# Patient Record
Sex: Female | Born: 1945
Health system: Southern US, Community
[De-identification: ages and names within clinical notes are randomized; demographics above are authoritative.]

## PROBLEM LIST (undated history)

## (undated) DIAGNOSIS — M810 Age-related osteoporosis without current pathological fracture: Secondary | ICD-10-CM

## (undated) DIAGNOSIS — S3992XA Unspecified injury of lower back, initial encounter: Secondary | ICD-10-CM

## (undated) DIAGNOSIS — E785 Hyperlipidemia, unspecified: Secondary | ICD-10-CM

## (undated) DIAGNOSIS — I10 Essential (primary) hypertension: Secondary | ICD-10-CM

## (undated) HISTORY — PX: POLYPECTOMY: SHX149

## (undated) HISTORY — DX: Age-related osteoporosis without current pathological fracture: M81.0

## (undated) HISTORY — DX: Hyperlipidemia, unspecified: E78.5

## (undated) HISTORY — DX: Essential (primary) hypertension: I10

## (undated) HISTORY — DX: Unspecified injury of lower back, initial encounter: S39.92XA

---

## 1999-06-28 ENCOUNTER — Other Ambulatory Visit: Admission: RE | Admit: 1999-06-28 | Discharge: 1999-06-28 | Payer: Self-pay | Admitting: *Deleted

## 1999-06-28 ENCOUNTER — Encounter: Admission: RE | Admit: 1999-06-28 | Discharge: 1999-06-28 | Payer: Self-pay | Admitting: *Deleted

## 1999-06-28 ENCOUNTER — Encounter: Payer: Self-pay | Admitting: *Deleted

## 2000-06-28 ENCOUNTER — Encounter: Payer: Self-pay | Admitting: *Deleted

## 2000-06-28 ENCOUNTER — Other Ambulatory Visit: Admission: RE | Admit: 2000-06-28 | Discharge: 2000-06-28 | Payer: Self-pay | Admitting: *Deleted

## 2000-06-28 ENCOUNTER — Encounter: Admission: RE | Admit: 2000-06-28 | Discharge: 2000-06-28 | Payer: Self-pay | Admitting: *Deleted

## 2002-04-11 ENCOUNTER — Other Ambulatory Visit: Admission: RE | Admit: 2002-04-11 | Discharge: 2002-04-11 | Payer: Self-pay | Admitting: Gynecology

## 2002-07-03 ENCOUNTER — Encounter: Payer: Self-pay | Admitting: Gynecology

## 2002-07-03 ENCOUNTER — Encounter: Admission: RE | Admit: 2002-07-03 | Discharge: 2002-07-03 | Payer: Self-pay | Admitting: Family Medicine

## 2003-07-07 ENCOUNTER — Encounter: Admission: RE | Admit: 2003-07-07 | Discharge: 2003-07-07 | Payer: Self-pay | Admitting: Gynecology

## 2004-07-14 ENCOUNTER — Encounter: Admission: RE | Admit: 2004-07-14 | Discharge: 2004-07-14 | Payer: Self-pay | Admitting: Obstetrics and Gynecology

## 2005-07-15 ENCOUNTER — Encounter: Admission: RE | Admit: 2005-07-15 | Discharge: 2005-07-15 | Payer: Self-pay | Admitting: Obstetrics and Gynecology

## 2006-07-18 ENCOUNTER — Encounter: Admission: RE | Admit: 2006-07-18 | Discharge: 2006-07-18 | Payer: Self-pay | Admitting: Obstetrics and Gynecology

## 2007-08-15 ENCOUNTER — Encounter: Admission: RE | Admit: 2007-08-15 | Discharge: 2007-08-15 | Payer: Self-pay | Admitting: Obstetrics and Gynecology

## 2008-08-15 ENCOUNTER — Encounter: Admission: RE | Admit: 2008-08-15 | Discharge: 2008-08-15 | Payer: Self-pay | Admitting: Obstetrics and Gynecology

## 2009-08-18 ENCOUNTER — Encounter: Admission: RE | Admit: 2009-08-18 | Discharge: 2009-08-18 | Payer: Self-pay | Admitting: Obstetrics and Gynecology

## 2010-07-15 ENCOUNTER — Other Ambulatory Visit: Payer: Self-pay | Admitting: Obstetrics and Gynecology

## 2010-07-15 DIAGNOSIS — Z1239 Encounter for other screening for malignant neoplasm of breast: Secondary | ICD-10-CM

## 2010-08-24 ENCOUNTER — Ambulatory Visit
Admission: RE | Admit: 2010-08-24 | Discharge: 2010-08-24 | Disposition: A | Discharge status: Home / Self Care | Payer: BC Managed Care – PPO | Source: Ambulatory Visit | Attending: Obstetrics and Gynecology | Admitting: Obstetrics and Gynecology

## 2010-08-24 DIAGNOSIS — Z1239 Encounter for other screening for malignant neoplasm of breast: Secondary | ICD-10-CM

## 2011-07-19 ENCOUNTER — Other Ambulatory Visit: Payer: Self-pay | Admitting: Family Medicine

## 2011-07-19 DIAGNOSIS — Z1231 Encounter for screening mammogram for malignant neoplasm of breast: Secondary | ICD-10-CM

## 2011-08-25 ENCOUNTER — Ambulatory Visit
Admission: RE | Admit: 2011-08-25 | Discharge: 2011-08-25 | Disposition: A | Discharge status: Home / Self Care | Payer: BC Managed Care – PPO | Source: Ambulatory Visit | Attending: Family Medicine | Admitting: Family Medicine

## 2011-08-25 DIAGNOSIS — Z1231 Encounter for screening mammogram for malignant neoplasm of breast: Secondary | ICD-10-CM

## 2011-09-06 ENCOUNTER — Other Ambulatory Visit: Payer: Self-pay | Admitting: Family Medicine

## 2011-09-06 DIAGNOSIS — Z780 Asymptomatic menopausal state: Secondary | ICD-10-CM

## 2011-09-13 ENCOUNTER — Ambulatory Visit
Admission: RE | Admit: 2011-09-13 | Discharge: 2011-09-13 | Disposition: A | Discharge status: Home / Self Care | Payer: BC Managed Care – PPO | Source: Ambulatory Visit | Attending: Family Medicine | Admitting: Family Medicine

## 2011-09-13 DIAGNOSIS — Z780 Asymptomatic menopausal state: Secondary | ICD-10-CM

## 2012-08-10 ENCOUNTER — Other Ambulatory Visit: Payer: Self-pay | Admitting: Family Medicine

## 2012-08-10 DIAGNOSIS — Z1231 Encounter for screening mammogram for malignant neoplasm of breast: Secondary | ICD-10-CM

## 2012-09-03 ENCOUNTER — Ambulatory Visit
Admission: RE | Admit: 2012-09-03 | Discharge: 2012-09-03 | Disposition: A | Discharge status: Home / Self Care | Payer: BC Managed Care – PPO | Source: Ambulatory Visit | Attending: Family Medicine | Admitting: Family Medicine

## 2013-02-11 ENCOUNTER — Other Ambulatory Visit: Payer: BC Managed Care – PPO

## 2013-02-11 DIAGNOSIS — I10 Essential (primary) hypertension: Secondary | ICD-10-CM

## 2013-02-11 DIAGNOSIS — E785 Hyperlipidemia, unspecified: Secondary | ICD-10-CM

## 2013-02-11 LAB — COMPREHENSIVE METABOLIC PANEL
AST: 16 U/L (ref 0–37)
Albumin: 4.4 g/dL (ref 3.5–5.2)
Calcium: 10 mg/dL (ref 8.4–10.5)
Chloride: 105 mEq/L (ref 96–112)
Sodium: 141 mEq/L (ref 135–145)
Total Bilirubin: 0.5 mg/dL (ref 0.3–1.2)

## 2013-02-11 LAB — LIPID PANEL: LDL Cholesterol: 100 mg/dL — ABNORMAL HIGH (ref 0–99)

## 2013-02-13 ENCOUNTER — Encounter: Payer: Self-pay | Admitting: Physician Assistant

## 2013-02-13 ENCOUNTER — Ambulatory Visit (INDEPENDENT_AMBULATORY_CARE_PROVIDER_SITE_OTHER): Payer: BC Managed Care – PPO | Admitting: Physician Assistant

## 2013-02-13 VITALS — BP 118/70 | HR 93 | Temp 98.7°F | Resp 18 | Ht 62.75 in | Wt 164.0 lb

## 2013-02-13 DIAGNOSIS — I10 Essential (primary) hypertension: Secondary | ICD-10-CM | POA: Insufficient documentation

## 2013-02-13 DIAGNOSIS — M810 Age-related osteoporosis without current pathological fracture: Secondary | ICD-10-CM

## 2013-02-13 DIAGNOSIS — E785 Hyperlipidemia, unspecified: Secondary | ICD-10-CM

## 2013-02-13 MED ORDER — ALENDRONATE SODIUM 70 MG PO TABS: 70.0000 mg | ORAL_TABLET | ORAL | Status: DC

## 2013-02-13 MED ORDER — LOSARTAN POTASSIUM-HCTZ 100-25 MG PO TABS: 1.0000 | ORAL_TABLET | Freq: Every day | ORAL | Status: DC

## 2013-02-13 MED ORDER — SIMVASTATIN 20 MG PO TABS: 20.0000 mg | ORAL_TABLET | Freq: Every day | ORAL | Status: DC

## 2013-02-13 MED ORDER — AMLODIPINE BESYLATE 5 MG PO TABS: 5.0000 mg | ORAL_TABLET | Freq: Every day | ORAL | Status: DC

## 2013-02-13 NOTE — Progress Notes (Signed)
Patient ID: Miranda Ramirez MRN: [id number], DOB: [birth year], 67 y.o. Date of Encounter: @DATE @  Chief Complaint:  Chief Complaint  Patient presents with  . follow up after labs    HPI: 67 y.o. year old white female  presents for routine followup office visit. She is a patient of Dr. Lelon Perla in the past. She started coming to this office by Dr. Lelon Perla was seeing patients here. But then her prior PCP retired at about the same time that Dr. Lelon Perla retired so she decided to just continue to come here to this office for followup her medical care.  I follow her for complete physical exam March of 2013. I then saw her for regular office visit in February 2014. She is taking the blood pressure medications as directed. She is having no adverse effects including no treatment edema.  She is taking the simvastatin 20 mg as directed as well. She is having no myalgias or other adverse effects.  She is taking the Fosamax once a week. She is having no problems with her jaw and no pain in her thigh. No signs or symptoms of osteonecrosis of the jaw and no signs or symptoms of atypical fracture of the femur. She is also still on the calcium and vitamin D.   Past Medical History  Diagnosis Date  . Hyperlipidemia   . Hypertension   . Osteoporosis      Home Meds: See attached medication section for current medication list. Any medications entered into computer today will not appear on this note's list. The medications listed below were entered prior to today. No current outpatient prescriptions on file prior to visit.   No current facility-administered medications on file prior to visit.    Allergies: No Known Allergies  History   Social History  . Marital Status: Married    Spouse Name: N/A    Number of Children: N/A  . Years of Education: N/A   Occupational History  . Not on file.   Social History Main Topics  . Smoking status: Never Smoker   . Smokeless tobacco: Never Used  .  Alcohol Use: No  . Drug Use: No  . Sexual Activity: Not on file   Other Topics Concern  . Not on file   Social History Narrative  . No narrative on file    Family History  Problem Relation Age of Onset  . Diabetes Sister   . COPD Sister   . Cancer Sister 49    kidney cancer     Review of Systems:  See HPI for pertinent ROS. All other ROS negative.    Physical Exam: Blood pressure 118/70, pulse 93, temperature 98.7 F (37.1 C), temperature source Oral, resp. rate 18, height 5' 2.75" (1.594 m), weight 164 lb (74.39 kg)., Body mass index is 29.28 kg/(m^2). General: Well nourished well-developed white female Appears in no acute distress. Neck: Supple. No thyromegaly. No lymphadenopathy. No carotid bruit Lungs: Clear bilaterally to auscultation without wheezes, rales, or rhonchi. Breathing is unlabored. Heart: RRR with S1 S2. No murmurs, rubs, or gallops. Abdomen: Soft, non-tender, non-distended with normoactive bowel sounds. No hepatomegaly. No rebound/guarding. No obvious abdominal masses. Musculoskeletal:  Strength and tone normal for age. Extremities/Skin: Warm and dry.  No edema. No rashes or suspicious lesions. Neuro: Alert and oriented X 3. Moves all extremities spontaneously. Gait is normal. CNII-XII grossly in tact. Psych:  Responds to questions appropriately with a normal affect.   Results for orders placed in visit on 02/11/13  COMPREHENSIVE METABOLIC PANEL      Result Value Range   Sodium 141  135 - 145 mEq/L   Potassium 4.6  3.5 - 5.3 mEq/L   Chloride 105  96 - 112 mEq/L   CO2 29  19 - 32 mEq/L   Glucose, Bld 92  70 - 99 mg/dL   BUN 20  6 - 23 mg/dL   Creat 1.61  0.96 - 0.45 mg/dL   Total Bilirubin 0.5  0.3 - 1.2 mg/dL   Alkaline Phosphatase 30 (*) 39 - 117 U/L   AST 16  0 - 37 U/L   ALT 19  0 - 35 U/L   Total Protein 6.8  6.0 - 8.3 g/dL   Albumin 4.4  3.5 - 5.2 g/dL   Calcium 40.9  8.4 - 81.1 mg/dL  LIPID PANEL      Result Value Range   Cholesterol 174   0 - 200 mg/dL   Triglycerides 95  <914 mg/dL   HDL 55  >78 mg/dL   Total CHOL/HDL Ratio 3.2     VLDL 19  0 - 40 mg/dL   LDL Cholesterol 295 (*) 0 - 99 mg/dL     ASSESSMENT AND PLAN:  67 y.o. year old female with  1. Hypertension At goal. He met normal. Continue current medications. Recheck 6 months.  2. Hyperlipidemia At goal. Continue current medications. Recheck 6 months  3. Osteoporosis She started the Fosamax March 2013. Should have repeat DEXA after one year of therapy. She has not had any followup DEXA since starting the Fosamax. She is agreeable to do so. We'll schedule this now.  We'll continue calcium vitamin D and Fosamax for now. - DG Bone Density; Future  4. screening mammogram: She have followup performed on 08/10/2012. This was normal.  5. screening colonoscopy: Patient is aware of the risk versus benefits. However she still defers this.  6. immunizations: A. pneumococcal this was given here on 08/31/2011 Zostavax: Patient defers. Even after long discussion she says that she has never had chickenpox. She still defers Zostavax.  [address] [place], [place], [facility] 02/13/2013 4:52 PM

## 2013-02-27 ENCOUNTER — Telehealth: Payer: Self-pay | Admitting: Family Medicine

## 2013-02-27 NOTE — Telephone Encounter (Signed)
Pt has left mess to call her back.  Her question was about Bone Density study ordered at last visit.  When [facility] to have done they questioned why it was needed early.  Her regular two year visit not due until March 2015 unless there was a medical reason to do sooner.  I called [facility] and was told still only need every two year scan unless patient on long term use of Steroids, then can request annually.  Will wait and reschedule in March 2015.

## 2013-02-28 NOTE — Telephone Encounter (Signed)
She had just started Fosamax in March 2013. I thought that a repeat DEXA was to be done in one year after starting initial therapy. However if this is not the protocol and that is fine to wait 2 years.

## 2013-03-02 ENCOUNTER — Other Ambulatory Visit: Payer: Self-pay | Admitting: Family Medicine

## 2013-07-24 ENCOUNTER — Other Ambulatory Visit: Payer: BC Managed Care – PPO

## 2013-07-24 DIAGNOSIS — Z79899 Other long term (current) drug therapy: Secondary | ICD-10-CM

## 2013-07-24 DIAGNOSIS — I10 Essential (primary) hypertension: Secondary | ICD-10-CM

## 2013-07-24 DIAGNOSIS — E785 Hyperlipidemia, unspecified: Secondary | ICD-10-CM

## 2013-07-24 DIAGNOSIS — M810 Age-related osteoporosis without current pathological fracture: Secondary | ICD-10-CM

## 2013-07-24 LAB — CBC WITH DIFFERENTIAL/PLATELET
Basophils Absolute: 0 10*3/uL (ref 0.0–0.1)
Basophils Relative: 0 % (ref 0–1)
Eosinophils Absolute: 0.2 10*3/uL (ref 0.0–0.7)
Eosinophils Relative: 2 % (ref 0–5)
HEMATOCRIT: 40.9 % (ref 36.0–46.0)
Hemoglobin: 13.4 g/dL (ref 12.0–15.0)
LYMPHS PCT: 38 % (ref 12–46)
Lymphs Abs: 3.9 10*3/uL (ref 0.7–4.0)
MCH: 28.8 pg (ref 26.0–34.0)
MCHC: 32.8 g/dL (ref 30.0–36.0)
MCV: 87.8 fL (ref 78.0–100.0)
Monocytes Absolute: 0.7 10*3/uL (ref 0.1–1.0)
Monocytes Relative: 7 % (ref 3–12)
NEUTROS PCT: 53 % (ref 43–77)
Neutro Abs: 5.4 10*3/uL (ref 1.7–7.7)
PLATELETS: 377 10*3/uL (ref 150–400)
RBC: 4.66 MIL/uL (ref 3.87–5.11)
RDW: 13.1 % (ref 11.5–15.5)
WBC: 10.2 10*3/uL (ref 4.0–10.5)

## 2013-07-24 LAB — COMPLETE METABOLIC PANEL WITH GFR
ALBUMIN: 4.2 g/dL (ref 3.5–5.2)
ALT: 15 U/L (ref 0–35)
AST: 12 U/L (ref 0–37)
Alkaline Phosphatase: 31 U/L — ABNORMAL LOW (ref 39–117)
BILIRUBIN TOTAL: 0.5 mg/dL (ref 0.2–1.2)
BUN: 11 mg/dL (ref 6–23)
CHLORIDE: 107 meq/L (ref 96–112)
CO2: 27 mEq/L (ref 19–32)
Calcium: 9.2 mg/dL (ref 8.4–10.5)
Creat: 0.66 mg/dL (ref 0.50–1.10)
GLUCOSE: 97 mg/dL (ref 70–99)
POTASSIUM: 4.1 meq/L (ref 3.5–5.3)
SODIUM: 143 meq/L (ref 135–145)
Total Protein: 6.5 g/dL (ref 6.0–8.3)

## 2013-07-24 LAB — LIPID PANEL
Cholesterol: 163 mg/dL (ref 0–200)
HDL: 48 mg/dL (ref >39)
LDL CALC: 80 mg/dL (ref 0–99)
TRIGLYCERIDES: 173 mg/dL — AB (ref <150)
Total CHOL/HDL Ratio: 3.4 Ratio
VLDL: 35 mg/dL (ref 0–40)

## 2013-07-25 ENCOUNTER — Ambulatory Visit (INDEPENDENT_AMBULATORY_CARE_PROVIDER_SITE_OTHER): Payer: BC Managed Care – PPO | Admitting: Physician Assistant

## 2013-07-25 ENCOUNTER — Encounter: Payer: Self-pay | Admitting: Physician Assistant

## 2013-07-25 VITALS — BP 146/90 | HR 76 | Temp 98.9°F | Resp 18 | Ht 62.5 in | Wt 162.0 lb

## 2013-07-25 DIAGNOSIS — M810 Age-related osteoporosis without current pathological fracture: Secondary | ICD-10-CM

## 2013-07-25 DIAGNOSIS — E559 Vitamin D deficiency, unspecified: Secondary | ICD-10-CM

## 2013-07-25 DIAGNOSIS — E785 Hyperlipidemia, unspecified: Secondary | ICD-10-CM

## 2013-07-25 DIAGNOSIS — I10 Essential (primary) hypertension: Secondary | ICD-10-CM

## 2013-07-25 LAB — VITAMIN D 25 HYDROXY (VIT D DEFICIENCY, FRACTURES): VIT D 25 HYDROXY: 19 ng/mL — AB (ref 30–89)

## 2013-07-25 NOTE — Progress Notes (Signed)
Patient ID: Miranda Ramirez MRN: [id number], DOB: 23-Aug-1945, 68 y.o. Date of Encounter: '@DATE' @  Chief Complaint:  Chief Complaint  Patient presents with  . follow up after labs/ med RF    HPI: 68 y.o. year old female  Presents for f/u OV.  She was a patient of Dr. Harriett Rush in the past. She started coming to this office when Dr. Ree Edman was seeing patients here.  But then her prior PCP retired at about the same time that Dr. Harriett Rush retired.  She decided to continue to come here to this office for her followup medical care.  She had a complete physical exam with me March 2013. She has had regular office visits with me February 2014 and then again 01/2013. She is here again today for followup regular office visit.  She has no complaints today and says that she's been feeling good.  She is taking blood pressure medications as directed. Having no adverse effects including no LE edema. No lightheadedness.  She is taking simvastatin 20 mg. She is having no myalgias or other adverse effects.  She is taking Fosamax once a week. Having no jaw pain and no pain in the thigh. No other adv effects.    Past Medical History  Diagnosis Date  . Hyperlipidemia   . Hypertension   . Osteoporosis      Home Meds: See attached medication section for current medication list. Any medications entered into computer today will not appear on this note's list. The medications listed below were entered prior to today. Current Outpatient Prescriptions on File Prior to Visit  Medication Sig Dispense Refill  . alendronate (FOSAMAX) 70 MG tablet TAKE ONE TABLET BY MOUTH ONCE A WEEK  4 tablet  11  . amLODipine (NORVASC) 5 MG tablet Take 1 tablet (5 mg total) by mouth daily.  30 tablet  11  . losartan-hydrochlorothiazide (HYZAAR) 100-25 MG per tablet Take 1 tablet by mouth daily.  30 tablet  5  . simvastatin (ZOCOR) 20 MG tablet Take 1 tablet (20 mg total) by mouth at bedtime.  30 tablet  5   No current  facility-administered medications on file prior to visit.    Allergies: No Known Allergies  History   Social History  . Marital Status: Married    Spouse Name: N/A    Number of Children: N/A  . Years of Education: N/A   Occupational History  . Not on file.   Social History Main Topics  . Smoking status: Never Smoker   . Smokeless tobacco: Never Used  . Alcohol Use: No  . Drug Use: No  . Sexual Activity: Not on file   Other Topics Concern  . Not on file   Social History Narrative  . No narrative on file    Family History  Problem Relation Age of Onset  . Diabetes Sister   . COPD Sister   . Cancer Sister 54    kidney cancer     Review of Systems:  See HPI for pertinent ROS. All other ROS negative.    Physical Exam: Blood pressure 146/90, pulse 76, temperature 98.9 F (37.2 C), temperature source Oral, resp. rate 18, height 5' 2.5" (1.588 m), weight 162 lb (73.483 kg)., Body mass index is 29.14 kg/(m^2). General: WNWD WF. Appears in no acute distress. Neck: Supple. No thyromegaly. No lymphadenopathy. No carotid bruit. Lungs: Clear bilaterally to auscultation without wheezes, rales, or rhonchi. Breathing is unlabored. Heart: RRR with S1 S2. No murmurs, rubs,  or gallops. Abdomen: Soft, non-tender, non-distended with normoactive bowel sounds. No hepatomegaly. No rebound/guarding. No obvious abdominal masses. Musculoskeletal:  Strength and tone normal for age. Extremities/Skin: Warm and dry. No clubbing or cyanosis. No edema. No rashes or suspicious lesions. Neuro: Alert and oriented X 3. Moves all extremities spontaneously. Gait is normal. CNII-XII grossly in tact. Psych:  Responds to questions appropriately with a normal affect.   Results for orders placed in visit on 07/24/13  CBC WITH DIFFERENTIAL      Result Value Range   WBC 10.2  4.0 - 10.5 K/uL   RBC 4.66  3.87 - 5.11 MIL/uL   Hemoglobin 13.4  12.0 - 15.0 g/dL   HCT 40.9  36.0 - 46.0 %   MCV 87.8  78.0 -  100.0 fL   MCH 28.8  26.0 - 34.0 pg   MCHC 32.8  30.0 - 36.0 g/dL   RDW 13.1  11.5 - 15.5 %   Platelets 377  150 - 400 K/uL   Neutrophils Relative % 53  43 - 77 %   Neutro Abs 5.4  1.7 - 7.7 K/uL   Lymphocytes Relative 38  12 - 46 %   Lymphs Abs 3.9  0.7 - 4.0 K/uL   Monocytes Relative 7  3 - 12 %   Monocytes Absolute 0.7  0.1 - 1.0 K/uL   Eosinophils Relative 2  0 - 5 %   Eosinophils Absolute 0.2  0.0 - 0.7 K/uL   Basophils Relative 0  0 - 1 %   Basophils Absolute 0.0  0.0 - 0.1 K/uL   Smear Review Criteria for review not met    LIPID PANEL      Result Value Range   Cholesterol 163  0 - 200 mg/dL   Triglycerides 173 (*) <150 mg/dL   HDL 48  >39 mg/dL   Total CHOL/HDL Ratio 3.4     VLDL 35  0 - 40 mg/dL   LDL Cholesterol 80  0 - 99 mg/dL  VITAMIN D 25 HYDROXY      Result Value Range   Vit D, 25-Hydroxy 19 (*) 30 - 89 ng/mL  COMPLETE METABOLIC PANEL WITH GFR      Result Value Range   Sodium 143  135 - 145 mEq/L   Potassium 4.1  3.5 - 5.3 mEq/L   Chloride 107  96 - 112 mEq/L   CO2 27  19 - 32 mEq/L   Glucose, Bld 97  70 - 99 mg/dL   BUN 11  6 - 23 mg/dL   Creat 0.66  0.50 - 1.10 mg/dL   Total Bilirubin 0.5  0.2 - 1.2 mg/dL   Alkaline Phosphatase 31 (*) 39 - 117 U/L   AST 12  0 - 37 U/L   ALT 15  0 - 35 U/L   Total Protein 6.5  6.0 - 8.3 g/dL   Albumin 4.2  3.5 - 5.2 g/dL   Calcium 9.2  8.4 - 10.5 mg/dL   GFR, Est African American >89     GFR, Est Non African American >89       ASSESSMENT AND PLAN:  68 y.o. year old female with  1. Hyperlipidemia She came yesterday fasting for labs. Lipid panel is excellent. Continue current medication. LFTs are also normal.  2. Hypertension Blood pressure reading today was a little bit high. However patient states that she checked it at home this morning prior to leaving and got 128/78. Says that she checks it  routinely at home and on always has good readings. As well blood pressure was good at her last visit here. Therefore we'll  continue current medications without change. BMPT numbers are all normal.  3. Osteoporosis She started Fosamax March 2013. At her last visit at here I told her she should have a DEXA scan one year after starting therapy. Therefore I placed an order for this at her last visit here. However she states that they told her that she had to wait 2 years to have a followup scan. She says that she plans to have a followup DEXA scan when she does her her next mammogram. I reviewed to her low vitamin D level above and his labs. To review her medication list. She is not on any calcium or vitamin D currently. Told her to start taking over-the-counter calcium approximately 1000-1200 mg daily. Told her to start taking vitamin D 4000 units daily.  4. Vitamin D deficiency See  #3 above. She is to start taking 4000 units daily and will we will recheck his labs at her next visit.  5. screening mammogram: The last was 08/10/2012. Normal. She plans to schedule followup.  6. screening colonoscopy: Patient is aware of risk versus benefits. However she still defers this.  7. Immunizations: Pneumonia vaccine: Given here 08/31/2011 Zostavax: Patient defers. Even after long discussion she still defers.  ROV 6 months, sooner if needed.   [address] [place], [place], [facility] 07/25/2013 9:34 AM

## 2013-08-19 ENCOUNTER — Other Ambulatory Visit: Payer: Self-pay | Admitting: Family Medicine

## 2013-08-29 ENCOUNTER — Other Ambulatory Visit: Payer: Self-pay

## 2013-08-29 DIAGNOSIS — Z1231 Encounter for screening mammogram for malignant neoplasm of breast: Secondary | ICD-10-CM

## 2013-09-13 ENCOUNTER — Ambulatory Visit
Admission: RE | Admit: 2013-09-13 | Discharge: 2013-09-13 | Disposition: A | Discharge status: Home / Self Care | Payer: Self-pay | Source: Ambulatory Visit

## 2013-09-13 DIAGNOSIS — Z1231 Encounter for screening mammogram for malignant neoplasm of breast: Secondary | ICD-10-CM

## 2013-09-18 ENCOUNTER — Telehealth: Payer: Self-pay | Admitting: Family Medicine

## 2013-09-18 DIAGNOSIS — I10 Essential (primary) hypertension: Secondary | ICD-10-CM

## 2013-09-18 MED ORDER — LOSARTAN POTASSIUM-HCTZ 100-25 MG PO TABS: 1.0000 | ORAL_TABLET | Freq: Every day | ORAL | Status: DC

## 2013-09-18 NOTE — Telephone Encounter (Signed)
Medication refilled per protocol. 

## 2013-09-26 ENCOUNTER — Telehealth: Payer: Self-pay | Admitting: Family Medicine

## 2013-09-26 DIAGNOSIS — E785 Hyperlipidemia, unspecified: Secondary | ICD-10-CM

## 2013-09-26 MED ORDER — SIMVASTATIN 20 MG PO TABS: 20.0000 mg | ORAL_TABLET | Freq: Every day | ORAL | Status: DC

## 2013-09-26 NOTE — Telephone Encounter (Signed)
Medication refilled per protocol. 

## 2014-01-22 ENCOUNTER — Other Ambulatory Visit: Payer: BC Managed Care – PPO

## 2014-01-22 DIAGNOSIS — Z79899 Other long term (current) drug therapy: Secondary | ICD-10-CM

## 2014-01-22 DIAGNOSIS — E559 Vitamin D deficiency, unspecified: Secondary | ICD-10-CM

## 2014-01-22 DIAGNOSIS — E785 Hyperlipidemia, unspecified: Secondary | ICD-10-CM

## 2014-01-22 DIAGNOSIS — I10 Essential (primary) hypertension: Secondary | ICD-10-CM

## 2014-01-22 LAB — LIPID PANEL
CHOL/HDL RATIO: 3 ratio
Cholesterol: 172 mg/dL (ref 0–200)
HDL: 57 mg/dL (ref >39)
LDL Cholesterol: 92 mg/dL (ref 0–99)
TRIGLYCERIDES: 114 mg/dL (ref <150)
VLDL: 23 mg/dL (ref 0–40)

## 2014-01-22 LAB — CBC WITH DIFFERENTIAL/PLATELET
BASOS ABS: 0 10*3/uL (ref 0.0–0.1)
BASOS PCT: 0 % (ref 0–1)
EOS ABS: 0.1 10*3/uL (ref 0.0–0.7)
Eosinophils Relative: 1 % (ref 0–5)
HEMATOCRIT: 39.4 % (ref 36.0–46.0)
Hemoglobin: 13.4 g/dL (ref 12.0–15.0)
LYMPHS PCT: 35 % (ref 12–46)
Lymphs Abs: 3.2 10*3/uL (ref 0.7–4.0)
MCH: 28.9 pg (ref 26.0–34.0)
MCHC: 34 g/dL (ref 30.0–36.0)
MCV: 84.9 fL (ref 78.0–100.0)
MONO ABS: 0.6 10*3/uL (ref 0.1–1.0)
Monocytes Relative: 7 % (ref 3–12)
NEUTROS ABS: 5.1 10*3/uL (ref 1.7–7.7)
NEUTROS PCT: 57 % (ref 43–77)
PLATELETS: 403 10*3/uL — AB (ref 150–400)
RBC: 4.64 MIL/uL (ref 3.87–5.11)
RDW: 13.9 % (ref 11.5–15.5)
WBC: 9 10*3/uL (ref 4.0–10.5)

## 2014-01-22 LAB — COMPREHENSIVE METABOLIC PANEL
ALK PHOS: 31 U/L — AB (ref 39–117)
ALT: 12 U/L (ref 0–35)
AST: 11 U/L (ref 0–37)
Albumin: 4.4 g/dL (ref 3.5–5.2)
BUN: 18 mg/dL (ref 6–23)
CHLORIDE: 105 meq/L (ref 96–112)
CO2: 28 mEq/L (ref 19–32)
CREATININE: 0.73 mg/dL (ref 0.50–1.10)
Calcium: 9.5 mg/dL (ref 8.4–10.5)
GLUCOSE: 101 mg/dL — AB (ref 70–99)
Potassium: 4 mEq/L (ref 3.5–5.3)
Sodium: 142 mEq/L (ref 135–145)
Total Bilirubin: 0.6 mg/dL (ref 0.2–1.2)
Total Protein: 6.9 g/dL (ref 6.0–8.3)

## 2014-01-23 LAB — VITAMIN D 25 HYDROXY (VIT D DEFICIENCY, FRACTURES): VIT D 25 HYDROXY: 29 ng/mL — AB (ref 30–89)

## 2014-02-06 ENCOUNTER — Encounter: Payer: Self-pay | Admitting: Physician Assistant

## 2014-02-06 ENCOUNTER — Ambulatory Visit (INDEPENDENT_AMBULATORY_CARE_PROVIDER_SITE_OTHER): Payer: BC Managed Care – PPO | Admitting: Physician Assistant

## 2014-02-06 VITALS — BP 118/70 | HR 72 | Temp 98.6°F | Resp 14 | Ht 63.0 in | Wt 159.0 lb

## 2014-02-06 DIAGNOSIS — E559 Vitamin D deficiency, unspecified: Secondary | ICD-10-CM

## 2014-02-06 DIAGNOSIS — Z23 Encounter for immunization: Secondary | ICD-10-CM

## 2014-02-06 DIAGNOSIS — M810 Age-related osteoporosis without current pathological fracture: Secondary | ICD-10-CM

## 2014-02-06 DIAGNOSIS — E785 Hyperlipidemia, unspecified: Secondary | ICD-10-CM

## 2014-02-06 DIAGNOSIS — I10 Essential (primary) hypertension: Secondary | ICD-10-CM

## 2014-02-06 MED ORDER — AMLODIPINE BESYLATE 5 MG PO TABS: 5.0000 mg | ORAL_TABLET | Freq: Every day | ORAL | Status: DC

## 2014-02-06 MED ORDER — SIMVASTATIN 20 MG PO TABS: 20.0000 mg | ORAL_TABLET | Freq: Every day | ORAL | Status: DC

## 2014-02-06 MED ORDER — LOSARTAN POTASSIUM-HCTZ 100-25 MG PO TABS: 1.0000 | ORAL_TABLET | Freq: Every day | ORAL | Status: DC

## 2014-02-06 MED ORDER — ALENDRONATE SODIUM 70 MG PO TABS: ORAL_TABLET | ORAL | Status: DC

## 2014-02-06 NOTE — Progress Notes (Signed)
Patient ID: Miranda Ramirez MRN: [id number], DOB: 03-27-1946, 68 y.o. Date of Encounter: '@DATE' @  Chief Complaint:  Chief Complaint  Patient presents with  . 6 month F/U    has had labs    HPI: 68 y.o. year old female  Presents for f/u OV.  She was a patient of Dr. Harriett Rush in the past. She started coming to this office when Dr. Ree Edman was seeing patients here.  But then her prior PCP retired at about the same time that Dr. Harriett Rush retired.  She decided to continue to come here to this office for her followup medical care.  She had a complete physical exam with me March 2013. She has had regular office visits with me February 2014 , 01/2013, 07/2013.  She is here again today for followup regular office visit.  She has no complaints today and says that she's been feeling good. She just took a trip to [place] and visited [place] positive visit [place]. Said that she did a lot of walking and felt good with this. No angina symptoms.  She is taking blood pressure medications as directed. Having no adverse effects including no LE edema. No lightheadedness.  She is taking simvastatin 20 mg. She is having no myalgias or other adverse effects.  She is taking Fosamax once a week. Having no jaw pain and no pain in the thigh. No other adv effects.    Past Medical History  Diagnosis Date  . Hyperlipidemia   . Hypertension   . Osteoporosis      Home Meds:  Outpatient Prescriptions Prior to Visit  Medication Sig Dispense Refill  . alendronate (FOSAMAX) 70 MG tablet TAKE ONE TABLET BY MOUTH ONCE A WEEK  4 tablet  11  . amLODipine (NORVASC) 5 MG tablet Take 1 tablet (5 mg total) by mouth daily.  30 tablet  11  . losartan-hydrochlorothiazide (HYZAAR) 100-25 MG per tablet Take 1 tablet by mouth daily.  30 tablet  5  . simvastatin (ZOCOR) 20 MG tablet Take 1 tablet (20 mg total) by mouth daily at 6 PM.  30 tablet  5   No facility-administered  medications prior to visit.     Allergies: No Known Allergies  History   Social History  . Marital Status: Married    Spouse Name: N/A    Number of Children: N/A  . Years of Education: N/A   Occupational History  . Not on file.   Social History Main Topics  . Smoking status: Never Smoker   . Smokeless tobacco: Never Used  . Alcohol Use: No  . Drug Use: No  . Sexual Activity: Not on file   Other Topics Concern  . Not on file   Social History Narrative  . No narrative on file    Family History  Problem Relation Age of Onset  . Diabetes Sister   . COPD Sister   . Cancer Sister 8    kidney cancer     Review of Systems:  See HPI for pertinent ROS. All other ROS negative.    Physical Exam: Blood pressure 118/70, pulse 72, temperature 98.6 F (37 C), temperature source Oral, resp. rate 14, height '5\' 3"'  (1.6 m), weight 159 lb (72.122 kg)., Body mass index is 28.17 kg/(m^2). General: WNWD WF. Appears in no acute distress. Neck: Supple. No thyromegaly. No lymphadenopathy. No carotid bruit. Lungs: Clear bilaterally to auscultation without wheezes, rales, or rhonchi. Breathing is unlabored. Heart:  RRR with S1 S2. No murmurs, rubs, or gallops. Abdomen: Soft, non-tender, non-distended with normoactive bowel sounds. No hepatomegaly. No rebound/guarding. No obvious abdominal masses. Musculoskeletal:  Strength and tone normal for age. Extremities/Skin: Warm and dry. No clubbing or cyanosis. No edema. No rashes or suspicious lesions. Neuro: Alert and oriented X 3. Moves all extremities spontaneously. Gait is normal. CNII-XII grossly in tact. Psych:  Responds to questions appropriately with a normal affect.   Results for orders placed in visit on 01/22/14  CBC WITH DIFFERENTIAL      Result Value Ref Range   WBC 9.0  4.0 - 10.5 K/uL   RBC 4.64  3.87 - 5.11 MIL/uL   Hemoglobin 13.4  12.0 - 15.0 g/dL   HCT 39.4  36.0 - 46.0 %   MCV 84.9  78.0 - 100.0 fL   MCH 28.9  26.0 -  34.0 pg   MCHC 34.0  30.0 - 36.0 g/dL   RDW 13.9  11.5 - 15.5 %   Platelets 403 (*) 150 - 400 K/uL   Neutrophils Relative % 57  43 - 77 %   Neutro Abs 5.1  1.7 - 7.7 K/uL   Lymphocytes Relative 35  12 - 46 %   Lymphs Abs 3.2  0.7 - 4.0 K/uL   Monocytes Relative 7  3 - 12 %   Monocytes Absolute 0.6  0.1 - 1.0 K/uL   Eosinophils Relative 1  0 - 5 %   Eosinophils Absolute 0.1  0.0 - 0.7 K/uL   Basophils Relative 0  0 - 1 %   Basophils Absolute 0.0  0.0 - 0.1 K/uL   Smear Review Criteria for review not met    COMPREHENSIVE METABOLIC PANEL      Result Value Ref Range   Sodium 142  135 - 145 mEq/L   Potassium 4.0  3.5 - 5.3 mEq/L   Chloride 105  96 - 112 mEq/L   CO2 28  19 - 32 mEq/L   Glucose, Bld 101 (*) 70 - 99 mg/dL   BUN 18  6 - 23 mg/dL   Creat 0.73  0.50 - 1.10 mg/dL   Total Bilirubin 0.6  0.2 - 1.2 mg/dL   Alkaline Phosphatase 31 (*) 39 - 117 U/L   AST 11  0 - 37 U/L   ALT 12  0 - 35 U/L   Total Protein 6.9  6.0 - 8.3 g/dL   Albumin 4.4  3.5 - 5.2 g/dL   Calcium 9.5  8.4 - 10.5 mg/dL  LIPID PANEL      Result Value Ref Range   Cholesterol 172  0 - 200 mg/dL   Triglycerides 114  <150 mg/dL   HDL 57  >39 mg/dL   Total CHOL/HDL Ratio 3.0     VLDL 23  0 - 40 mg/dL   LDL Cholesterol 92  0 - 99 mg/dL  VITAMIN D 25 HYDROXY      Result Value Ref Range   Vit D, 25-Hydroxy 29 (*) 30 - 89 ng/mL     ASSESSMENT AND PLAN:  68 y.o. year old female with  1. Hyperlipidemia She came recently fasting for labs. Lipid panel is excellent. Continue current medication. LFTs are also normal.  2. Hypertension Blood pressure at goal. BMET normal.  Continue current medications without change.  3. Osteoporosis She started Fosamax March 2013. At her last visit at here I told her she should have a DEXA scan one year after starting therapy. Therefore  I placed an order for this at her last visit here. However  At [place] 07/2013 she stated that they told her that she had to wait 2 years to have a  followup scan. She said that she plans to have a followup DEXA scan when she does her her next mammogram. Today--01/2014--she says that when she went for her mammogram she wanted to have her DEXA scan done. However said that was told it she would have to have an order from eyes. Says that she called here for an order but never heard back about it so decided it wasn't important. Today I told her that I could send an order right now and go ahead and get this done. She wants to wait and do it when she has her next mammogram. AT NEXT OV WITH ME I WILL NEED TO PLACE ORDER FOR THIS Had Mammo 08/2013-- Will get DEXA and Mammo 08/2014  At OV 07/2013--I reviewed to her low vitamin D level above and his labs. To review her medication list. She is not on any calcium or vitamin D currently. Told her to start taking over-the-counter calcium approximately 1000-1200 mg daily. Told her to start taking vitamin D 4000 units daily. Today--01/2014--she says that she took the calcium and vitamin D some but has not been taking it daily. I told her that she needs to put the calcium and vitamin D with her prescription medications and take them every single day.  4. Vitamin D deficiency See  #3 above. She is to start taking 4000 units daily and will we will recheck his labs at her next visit.  5. screening mammogram: The last was 08/2013--normal.   6. screening colonoscopy: Patient is aware of risk versus benefits. However she still defers this.  Today I discussed Hemoccult cards as another screening tool. She defers this as well.  Says that she is considering colonoscopy and we'll continue to "think about this"  7. Immunizations: Pneumonia vaccine: Pneumovax 23--Given here 08/31/2011.  Discussed Prevnar 13 today and patient agreeable. Prevnar 13 given today --01/2014 Zostavax: Patient defers. Even after long discussion she still defers.  ROV 6 months, sooner if needed.   Marin Olp [place], [place],  [facility] 02/06/2014 8:34 AM

## 2014-02-06 NOTE — Addendum Note (Signed)
Addended by: Sheral Flow on: 02/06/2014 11:12 AM   Modules accepted: Orders

## 2014-02-06 NOTE — Addendum Note (Signed)
Addended by: Sheral Flow on: 02/06/2014 08:50 AM   Modules accepted: Orders

## 2014-07-10 ENCOUNTER — Telehealth: Payer: Self-pay | Admitting: Physician Assistant

## 2014-07-10 DIAGNOSIS — I10 Essential (primary) hypertension: Secondary | ICD-10-CM

## 2014-07-10 DIAGNOSIS — E785 Hyperlipidemia, unspecified: Secondary | ICD-10-CM

## 2014-07-10 MED ORDER — SIMVASTATIN 20 MG PO TABS: 20.0000 mg | ORAL_TABLET | Freq: Every day | ORAL | Status: DC

## 2014-07-10 MED ORDER — LOSARTAN POTASSIUM-HCTZ 100-25 MG PO TABS: 1.0000 | ORAL_TABLET | Freq: Every day | ORAL | Status: DC

## 2014-07-10 NOTE — Telephone Encounter (Signed)
Patients needs refills on her simvastatin and alendronate and wants to know if she needs to make appointment before she gets these, she is going out of town for 3 weeks starting February 2nd  Please call her at [phone]

## 2014-07-10 NOTE — Telephone Encounter (Signed)
Needed Losartan and Simvastatin until return from trip.  Then will call and make 6 mth visit.  RF's sent

## 2014-08-12 ENCOUNTER — Other Ambulatory Visit: Payer: BLUE CROSS/BLUE SHIELD

## 2014-08-12 DIAGNOSIS — I10 Essential (primary) hypertension: Secondary | ICD-10-CM

## 2014-08-12 DIAGNOSIS — E559 Vitamin D deficiency, unspecified: Secondary | ICD-10-CM

## 2014-08-12 DIAGNOSIS — Z79899 Other long term (current) drug therapy: Secondary | ICD-10-CM

## 2014-08-12 DIAGNOSIS — E785 Hyperlipidemia, unspecified: Secondary | ICD-10-CM

## 2014-08-12 DIAGNOSIS — M810 Age-related osteoporosis without current pathological fracture: Secondary | ICD-10-CM

## 2014-08-12 LAB — COMPLETE METABOLIC PANEL WITH GFR
ALT: 14 U/L (ref 0–35)
AST: 12 U/L (ref 0–37)
Albumin: 3.9 g/dL (ref 3.5–5.2)
Alkaline Phosphatase: 31 U/L — ABNORMAL LOW (ref 39–117)
BILIRUBIN TOTAL: 0.4 mg/dL (ref 0.2–1.2)
BUN: 20 mg/dL (ref 6–23)
CALCIUM: 9 mg/dL (ref 8.4–10.5)
CHLORIDE: 105 meq/L (ref 96–112)
CO2: 22 mEq/L (ref 19–32)
Creat: 0.71 mg/dL (ref 0.50–1.10)
GFR, Est African American: >89 mL/min
GFR, Est Non African American: 87 mL/min
Glucose, Bld: 88 mg/dL (ref 70–99)
Potassium: 3.5 mEq/L (ref 3.5–5.3)
Sodium: 141 mEq/L (ref 135–145)
Total Protein: 6.5 g/dL (ref 6.0–8.3)

## 2014-08-12 LAB — CBC WITH DIFFERENTIAL/PLATELET
Basophils Absolute: 0.1 10*3/uL (ref 0.0–0.1)
Basophils Relative: 1 % (ref 0–1)
EOS PCT: 3 % (ref 0–5)
Eosinophils Absolute: 0.2 10*3/uL (ref 0.0–0.7)
HEMATOCRIT: 39.5 % (ref 36.0–46.0)
Hemoglobin: 12.9 g/dL (ref 12.0–15.0)
LYMPHS PCT: 53 % — AB (ref 12–46)
Lymphs Abs: 4.2 10*3/uL — ABNORMAL HIGH (ref 0.7–4.0)
MCH: 28.9 pg (ref 26.0–34.0)
MCHC: 32.7 g/dL (ref 30.0–36.0)
MCV: 88.6 fL (ref 78.0–100.0)
MONO ABS: 0.6 10*3/uL (ref 0.1–1.0)
MPV: 9.5 fL (ref 8.6–12.4)
Monocytes Relative: 8 % (ref 3–12)
NEUTROS ABS: 2.8 10*3/uL (ref 1.7–7.7)
NEUTROS PCT: 35 % — AB (ref 43–77)
Platelets: 342 10*3/uL (ref 150–400)
RBC: 4.46 MIL/uL (ref 3.87–5.11)
RDW: 13.5 % (ref 11.5–15.5)
WBC: 8 10*3/uL (ref 4.0–10.5)

## 2014-08-12 LAB — LIPID PANEL
Cholesterol: 159 mg/dL (ref 0–200)
HDL: 46 mg/dL (ref >=46)
LDL Cholesterol: 93 mg/dL (ref 0–99)
Total CHOL/HDL Ratio: 3.5 Ratio
Triglycerides: 99 mg/dL (ref <150)
VLDL: 20 mg/dL (ref 0–40)

## 2014-08-13 LAB — VITAMIN D 25 HYDROXY (VIT D DEFICIENCY, FRACTURES): Vit D, 25-Hydroxy: 24 ng/mL — ABNORMAL LOW (ref 30–100)

## 2014-08-14 ENCOUNTER — Encounter: Payer: Self-pay | Admitting: Physician Assistant

## 2014-08-14 ENCOUNTER — Ambulatory Visit (INDEPENDENT_AMBULATORY_CARE_PROVIDER_SITE_OTHER): Payer: BLUE CROSS/BLUE SHIELD | Admitting: Physician Assistant

## 2014-08-14 VITALS — BP 142/82 | HR 76 | Temp 98.2°F | Resp 18 | Ht 62.0 in | Wt 160.0 lb

## 2014-08-14 DIAGNOSIS — M810 Age-related osteoporosis without current pathological fracture: Secondary | ICD-10-CM

## 2014-08-14 DIAGNOSIS — Z1239 Encounter for other screening for malignant neoplasm of breast: Secondary | ICD-10-CM

## 2014-08-14 DIAGNOSIS — I10 Essential (primary) hypertension: Secondary | ICD-10-CM

## 2014-08-14 DIAGNOSIS — E559 Vitamin D deficiency, unspecified: Secondary | ICD-10-CM

## 2014-08-14 DIAGNOSIS — E785 Hyperlipidemia, unspecified: Secondary | ICD-10-CM

## 2014-08-14 MED ORDER — SIMVASTATIN 20 MG PO TABS: 20.0000 mg | ORAL_TABLET | Freq: Every day | ORAL | Status: DC

## 2014-08-14 MED ORDER — CALCIUM CARBONATE-VITAMIN D 500-200 MG-UNIT PO TABS: ORAL_TABLET | ORAL | Status: DC

## 2014-08-14 MED ORDER — LOSARTAN POTASSIUM-HCTZ 100-25 MG PO TABS: 1.0000 | ORAL_TABLET | Freq: Every day | ORAL | Status: DC

## 2014-08-14 MED ORDER — AMLODIPINE BESYLATE 5 MG PO TABS: 5.0000 mg | ORAL_TABLET | Freq: Every day | ORAL | Status: DC

## 2014-08-14 MED ORDER — VITAMIN D 50 MCG (2000 UT) PO CAPS: ORAL_CAPSULE | ORAL | Status: DC

## 2014-08-14 MED ORDER — ALENDRONATE SODIUM 70 MG PO TABS: ORAL_TABLET | ORAL | Status: DC

## 2014-08-14 NOTE — Progress Notes (Signed)
Patient ID: Miranda Ramirez MRN: [id number], DOB: 1945-12-10, 69 y.o. Date of Encounter: '@DATE' @  Chief Complaint:  Chief Complaint  Patient presents with  . 6 mth check up    has had labs    HPI: 69 y.o. year old female  Presents for f/u OV.  She was a patient of Dr. Harriett Rush in the past. She started coming to this office when Dr. Ree Edman was seeing patients here.  But then her prior PCP retired at about the same time that Dr. Harriett Rush retired.  She decided to continue to come here to this office for her followup medical care.  She had a complete physical exam with me March 2013. She has had regular office visits with me February 2014 , 01/2013, 07/2013, 01/2014.  She is here again today for followup regular office visit.  She has no complaints today and says that she's been feeling good. Just back from [place]. LOV 8/15--had just gotten back from [place], [place] that she did a lot of walking and felt good with this. No angina symptoms.  Asked what her next trip is scheduled. She says that she and her daughter will take her grandkids to the [facility] just before school starts back. They are 38 and 70 years old.  She is taking blood pressure medications as directed. Having no adverse effects including no LE edema. No lightheadedness.  She is taking simvastatin 20 mg. She is having no myalgias or other adverse effects.  She is taking Fosamax once a week. Having no jaw pain and no pain in the thigh. No other adv effects.    Past Medical History  Diagnosis Date  . Hyperlipidemia   . Hypertension   . Osteoporosis      Home Meds:  Outpatient Prescriptions Prior to Visit  Medication Sig Dispense Refill  . alendronate (FOSAMAX) 70 MG tablet TAKE ONE TABLET BY MOUTH ONCE A WEEK 4 tablet 11  . amLODipine (NORVASC) 5 MG tablet Take 1 tablet (5 mg total) by mouth daily. 30 tablet 11  . losartan-hydrochlorothiazide (HYZAAR) 100-25 MG per tablet Take 1  tablet by mouth daily. 30 tablet 1  . simvastatin (ZOCOR) 20 MG tablet Take 1 tablet (20 mg total) by mouth daily at 6 PM. 30 tablet 1   No facility-administered medications prior to visit.      Allergies: No Known Allergies  History   Social History  . Marital Status: Married    Spouse Name: N/A  . Number of Children: N/A  . Years of Education: N/A   Occupational History  . Not on file.   Social History Main Topics  . Smoking status: Never Smoker   . Smokeless tobacco: Never Used  . Alcohol Use: No  . Drug Use: No  . Sexual Activity: Not on file   Other Topics Concern  . Not on file   Social History Narrative    Family History  Problem Relation Age of Onset  . Diabetes Sister   . COPD Sister   . Cancer Sister 81    kidney cancer     Review of Systems:  See HPI for pertinent ROS. All other ROS negative.    Physical Exam: Blood pressure 142/82, pulse 76, temperature 98.2 F (36.8 C), temperature source Oral, resp. rate 18, height '5\' 2"'  (1.575 m), weight 160 lb (72.576 kg)., Body mass index is 29.26 kg/(m^2). General: WNWD WF. Appears in no acute distress. Neck: Supple.  No thyromegaly. No lymphadenopathy. No carotid bruit. Lungs: Clear bilaterally to auscultation without wheezes, rales, or rhonchi. Breathing is unlabored. Heart: RRR with S1 S2. No murmurs, rubs, or gallops. Abdomen: Soft, non-tender, non-distended with normoactive bowel sounds. No hepatomegaly. No rebound/guarding. No obvious abdominal masses. Musculoskeletal:  Strength and tone normal for age. Extremities/Skin: Warm and dry. No clubbing or cyanosis. No edema. No rashes or suspicious lesions. Neuro: Alert and oriented X 3. Moves all extremities spontaneously. Gait is normal. CNII-XII grossly in tact. Psych:  Responds to questions appropriately with a normal affect.   Results for orders placed or performed in visit on 08/12/14  CBC with Differential/Platelet  Result Value Ref Range   WBC 8.0  4.0 - 10.5 K/uL   RBC 4.46 3.87 - 5.11 MIL/uL   Hemoglobin 12.9 12.0 - 15.0 g/dL   HCT 39.5 36.0 - 46.0 %   MCV 88.6 78.0 - 100.0 fL   MCH 28.9 26.0 - 34.0 pg   MCHC 32.7 30.0 - 36.0 g/dL   RDW 13.5 11.5 - 15.5 %   Platelets 342 150 - 400 K/uL   MPV 9.5 8.6 - 12.4 fL   Neutrophils Relative % 35 (L) 43 - 77 %   Neutro Abs 2.8 1.7 - 7.7 K/uL   Lymphocytes Relative 53 (H) 12 - 46 %   Lymphs Abs 4.2 (H) 0.7 - 4.0 K/uL   Monocytes Relative 8 3 - 12 %   Monocytes Absolute 0.6 0.1 - 1.0 K/uL   Eosinophils Relative 3 0 - 5 %   Eosinophils Absolute 0.2 0.0 - 0.7 K/uL   Basophils Relative 1 0 - 1 %   Basophils Absolute 0.1 0.0 - 0.1 K/uL   Smear Review Criteria for review not met   COMPLETE METABOLIC PANEL WITH GFR  Result Value Ref Range   Sodium 141 135 - 145 mEq/L   Potassium 3.5 3.5 - 5.3 mEq/L   Chloride 105 96 - 112 mEq/L   CO2 22 19 - 32 mEq/L   Glucose, Bld 88 70 - 99 mg/dL   BUN 20 6 - 23 mg/dL   Creat 0.71 0.50 - 1.10 mg/dL   Total Bilirubin 0.4 0.2 - 1.2 mg/dL   Alkaline Phosphatase 31 (L) 39 - 117 U/L   AST 12 0 - 37 U/L   ALT 14 0 - 35 U/L   Total Protein 6.5 6.0 - 8.3 g/dL   Albumin 3.9 3.5 - 5.2 g/dL   Calcium 9.0 8.4 - 10.5 mg/dL   GFR, Est African American >89 mL/min   GFR, Est Non African American 87 mL/min  Lipid panel  Result Value Ref Range   Cholesterol 159 0 - 200 mg/dL   Triglycerides 99 <150 mg/dL   HDL 46 >=46 mg/dL   Total CHOL/HDL Ratio 3.5 Ratio   VLDL 20 0 - 40 mg/dL   LDL Cholesterol 93 0 - 99 mg/dL  Vit D  25 hydroxy (rtn osteoporosis monitoring)  Result Value Ref Range   Vit D, 25-Hydroxy 24 (L) 30 - 100 ng/mL     ASSESSMENT AND PLAN:  69 y.o. year old female with  1. Hyperlipidemia She came recently fasting for labs. Lipid panel is excellent. Continue current medication. LFTs are also normal.  2. Hypertension Blood pressure is controlled. BMET normal.  Continue current medications without change.  3. Osteoporosis She started  Fosamax March 2013. At her last visit at here I told her she should have a DEXA scan one year after  starting therapy. Therefore I placed an order for this at her last visit here. However  At [place] 07/2013 she stated that they told her that she had to wait 2 years to have a followup scan. She said that she plans to have a followup DEXA scan when she does her her next mammogram. Today--01/2014--she says that when she went for her mammogram she wanted to have her DEXA scan done. However said that was told it she would have to have an order from [place]. Says that she called here for an order but never heard back about it so decided it wasn't important. Today I told her that I could send an order right now and go ahead and get this done. She wants to wait and do it when she has her next mammogram. AT NEXT OV WITH ME I WILL NEED TO PLACE ORDER FOR THIS Had Mammo 08/2013-- Will get DEXA and Mammo 08/2014 At OV 08/14/2014---I placed order for Mammo and DEXA--to be done same day together !!  At [place] 07/2013--I reviewed to her low vitamin D level above and his labs. To review her medication list. She is not on any calcium or vitamin D currently. Told her to start taking over-the-counter calcium approximately 1000-1200 mg daily. Told her to start taking vitamin D 4000 units daily. At OV--01/2014--she says that she took the calcium and vitamin D some but has not been taking it daily. I told her that she needs to put the calcium and vitamin D with her prescription medications and take them every single day. At [place] 2/16-- says she is [place] and D most days--Discussed --again---take every day!!!  4. Vitamin D deficiency See  #3 above. She is to start taking 4000 units daily and will we will recheck his labs at her next visit.  5. screening mammogram: The last was 08/2013--normal.   6. screening colonoscopy: Patient is aware of risk versus benefits. However she still defers this.  Today I discussed Hemoccult cards as another  screening tool. She defers this as well.  Says that she is considering colonoscopy and we'll continue to "think about this"  7. Immunizations: Pneumonia vaccine: Pneumovax 23--Given here 08/31/2011.  Discussed Prevnar 13 today and patient agreeable. Prevnar 13 given today --01/2014 Zostavax: Patient defers. Even after long discussion she still defers. Tetanus--not covered by medicare--  ROV 6 months, sooner if needed.   Marin Olp [place], [place], [facility] 08/14/2014 9:25 AM

## 2014-09-15 ENCOUNTER — Ambulatory Visit
Admission: RE | Admit: 2014-09-15 | Discharge: 2014-09-15 | Disposition: A | Discharge status: Home / Self Care | Payer: BLUE CROSS/BLUE SHIELD | Source: Ambulatory Visit | Attending: Physician Assistant | Admitting: Physician Assistant

## 2014-09-15 DIAGNOSIS — Z1239 Encounter for other screening for malignant neoplasm of breast: Secondary | ICD-10-CM

## 2014-09-15 DIAGNOSIS — M810 Age-related osteoporosis without current pathological fracture: Secondary | ICD-10-CM

## 2014-09-16 ENCOUNTER — Other Ambulatory Visit: Payer: Self-pay | Admitting: Physician Assistant

## 2014-09-16 DIAGNOSIS — R928 Other abnormal and inconclusive findings on diagnostic imaging of breast: Secondary | ICD-10-CM

## 2014-09-19 ENCOUNTER — Ambulatory Visit
Admission: RE | Admit: 2014-09-19 | Discharge: 2014-09-19 | Disposition: A | Discharge status: Home / Self Care | Payer: BLUE CROSS/BLUE SHIELD | Source: Ambulatory Visit | Attending: Physician Assistant | Admitting: Physician Assistant

## 2014-09-19 DIAGNOSIS — R928 Other abnormal and inconclusive findings on diagnostic imaging of breast: Secondary | ICD-10-CM

## 2015-02-17 ENCOUNTER — Other Ambulatory Visit: Payer: BLUE CROSS/BLUE SHIELD

## 2015-02-17 DIAGNOSIS — E559 Vitamin D deficiency, unspecified: Secondary | ICD-10-CM

## 2015-02-17 DIAGNOSIS — E785 Hyperlipidemia, unspecified: Secondary | ICD-10-CM

## 2015-02-17 DIAGNOSIS — I10 Essential (primary) hypertension: Secondary | ICD-10-CM

## 2015-02-17 DIAGNOSIS — M810 Age-related osteoporosis without current pathological fracture: Secondary | ICD-10-CM

## 2015-02-17 DIAGNOSIS — Z79899 Other long term (current) drug therapy: Secondary | ICD-10-CM

## 2015-02-17 LAB — COMPLETE METABOLIC PANEL WITH GFR
ALBUMIN: 4.2 g/dL (ref 3.6–5.1)
ALT: 16 U/L (ref 6–29)
AST: 14 U/L (ref 10–35)
Alkaline Phosphatase: 29 U/L — ABNORMAL LOW (ref 33–130)
BILIRUBIN TOTAL: 0.5 mg/dL (ref 0.2–1.2)
BUN: 15 mg/dL (ref 7–25)
CO2: 27 mmol/L (ref 20–31)
Calcium: 9.2 mg/dL (ref 8.6–10.4)
Chloride: 105 mmol/L (ref 98–110)
Creat: 0.66 mg/dL (ref 0.60–0.93)
GFR, Est African American: >89 mL/min (ref >=60)
GLUCOSE: 92 mg/dL (ref 70–99)
Potassium: 3.9 mmol/L (ref 3.5–5.3)
SODIUM: 141 mmol/L (ref 135–146)
TOTAL PROTEIN: 6.5 g/dL (ref 6.1–8.1)

## 2015-02-17 LAB — LIPID PANEL
CHOL/HDL RATIO: 3 ratio (ref <=5.0)
Cholesterol: 156 mg/dL (ref 125–200)
HDL: 52 mg/dL (ref >=46)
LDL CALC: 75 mg/dL (ref <130)
Triglycerides: 147 mg/dL (ref <150)
VLDL: 29 mg/dL (ref <30)

## 2015-02-17 LAB — CBC WITH DIFFERENTIAL/PLATELET
BASOS PCT: 1 % (ref 0–1)
Basophils Absolute: 0.1 10*3/uL (ref 0.0–0.1)
Eosinophils Absolute: 0.2 10*3/uL (ref 0.0–0.7)
Eosinophils Relative: 2 % (ref 0–5)
HCT: 40.2 % (ref 36.0–46.0)
Hemoglobin: 13.4 g/dL (ref 12.0–15.0)
Lymphocytes Relative: 35 % (ref 12–46)
Lymphs Abs: 3.3 10*3/uL (ref 0.7–4.0)
MCH: 29.6 pg (ref 26.0–34.0)
MCHC: 33.3 g/dL (ref 30.0–36.0)
MCV: 88.7 fL (ref 78.0–100.0)
MONO ABS: 0.7 10*3/uL (ref 0.1–1.0)
MPV: 10 fL (ref 8.6–12.4)
Monocytes Relative: 8 % (ref 3–12)
NEUTROS ABS: 5 10*3/uL (ref 1.7–7.7)
Neutrophils Relative %: 54 % (ref 43–77)
Platelets: 398 10*3/uL (ref 150–400)
RBC: 4.53 MIL/uL (ref 3.87–5.11)
RDW: 13.9 % (ref 11.5–15.5)
WBC: 9.3 10*3/uL (ref 4.0–10.5)

## 2015-02-17 LAB — TSH: TSH: 1.017 u[IU]/mL (ref 0.350–4.500)

## 2015-02-18 ENCOUNTER — Ambulatory Visit (INDEPENDENT_AMBULATORY_CARE_PROVIDER_SITE_OTHER): Payer: BLUE CROSS/BLUE SHIELD | Admitting: Physician Assistant

## 2015-02-18 ENCOUNTER — Encounter: Payer: Self-pay | Admitting: Physician Assistant

## 2015-02-18 VITALS — BP 126/70 | HR 80 | Temp 98.4°F | Resp 16 | Ht 63.0 in | Wt 161.0 lb

## 2015-02-18 DIAGNOSIS — I10 Essential (primary) hypertension: Secondary | ICD-10-CM | POA: Diagnosis not present

## 2015-02-18 DIAGNOSIS — E559 Vitamin D deficiency, unspecified: Secondary | ICD-10-CM | POA: Diagnosis not present

## 2015-02-18 DIAGNOSIS — E785 Hyperlipidemia, unspecified: Secondary | ICD-10-CM

## 2015-02-18 DIAGNOSIS — M810 Age-related osteoporosis without current pathological fracture: Secondary | ICD-10-CM | POA: Diagnosis not present

## 2015-02-18 LAB — VITAMIN D 25 HYDROXY (VIT D DEFICIENCY, FRACTURES): Vit D, 25-Hydroxy: 30 ng/mL (ref 30–100)

## 2015-02-18 NOTE — Progress Notes (Signed)
Patient ID: Miranda Ramirez MRN: [id number], DOB: 1946/05/29, 69 y.o. Date of Encounter: _0 @  Chief Complaint:  Chief Complaint  Patient presents with  . Medication Management  . Medication Refill    HPI: 69 y.o. year old female  Presents for f/u OV.  She was a patient of Dr. Harriett Rush in the past. She started coming to this office when Dr. Ree Edman was seeing patients here.  But then her prior PCP retired at about the same time that Dr. Harriett Rush retired.  She decided to continue to come here to this office for her followup medical care.  She had a complete physical exam with me March 2013. She has had regular office visits with me 07/2012 , 01/2013, 07/2013, 01/2014, 07/2014 and now 01/2015.  She has no complaints today and says that she's been feeling good.  Today--01/2015--says that she just took her 2 grandchildren who are ages 69 and 22 on a [place] cruise as a last trip before starting back to school. At her OV 07/2014 she said she just back from [place]. At [place] 8/15--had just gotten back from [place], [facility] etc.   She says that she has been doing a lot of walking. Says that she "has always enjoyed walking but it is even more fun now that she has a fit bit" and she can monitor the number of steps she takes per day.  She is taking blood pressure medications as directed. Having no adverse effects including no LE edema. No lightheadedness.  She is taking simvastatin 20 mg. She is having no myalgias or other adverse effects.  She is taking Fosamax once a week. Having no jaw pain and no pain in the thigh. No other adv effects.  Says that she also is taking her calcium and vitamin D every single day as I had discussed with her at prior visit.   Past Medical History  Diagnosis Date  . Hyperlipidemia   . Hypertension   . Osteoporosis      Home Meds:  Outpatient Prescriptions Prior to Visit  Medication Sig Dispense Refill  . alendronate (FOSAMAX) 70 MG tablet  TAKE ONE TABLET BY MOUTH ONCE A WEEK 4 tablet 11  . amLODipine (NORVASC) 5 MG tablet Take 1 tablet (5 mg total) by mouth daily. 30 tablet 11  . calcium-vitamin D (OSCAL WITH D) 500-200 MG-UNIT per tablet Takes 1,000 mg Calcium daily. 30 tablet 11  . Cholecalciferol (VITAMIN D) 2000 UNITS CAPS Take 2 daily--4,000 IU Daily. 30 capsule 11  . losartan-hydrochlorothiazide (HYZAAR) 100-25 MG per tablet Take 1 tablet by mouth daily. 30 tablet 5  . simvastatin (ZOCOR) 20 MG tablet Take 1 tablet (20 mg total) by mouth daily at 6 PM. 30 tablet 5   No facility-administered medications prior to visit.      Allergies: No Known Allergies  Social History   Social History  . Marital Status: Married    Spouse Name: N/A  . Number of Children: N/A  . Years of Education: N/A   Occupational History  . Not on file.   Social History Main Topics  . Smoking status: Never Smoker   . Smokeless tobacco: Never Used  . Alcohol Use: No  . Drug Use: No  . Sexual Activity: Not on file   Other Topics Concern  . Not on file   Social History Narrative    Family History  Problem Relation Age of Onset  . Diabetes Sister   . COPD Sister   .  Cancer Sister 54    kidney cancer     Review of Systems:  See HPI for pertinent ROS. All other ROS negative.    Physical Exam: Blood pressure 126/70, pulse 80, temperature 98.4 F (36.9 C), temperature source Oral, resp. rate 16, height _0  (1.6 m), weight 161 lb (73.029 kg)., Body mass index is 28.53 kg/(m^2). General: WNWD WF. Appears in no acute distress. Neck: Supple. No thyromegaly. No lymphadenopathy. No carotid bruit. Lungs: Clear bilaterally to auscultation without wheezes, rales, or rhonchi. Breathing is unlabored. Heart: RRR with S1 S2. No murmurs, rubs, or gallops. Abdomen: Soft, non-tender, non-distended with normoactive bowel sounds. No hepatomegaly. No rebound/guarding. No obvious abdominal masses. Musculoskeletal:  Strength and tone normal for  age. Extremities/Skin: Warm and dry. No clubbing or cyanosis. No edema. No rashes or suspicious lesions. Neuro: Alert and oriented X 3. Moves all extremities spontaneously. Gait is normal. CNII-XII grossly in tact. Psych:  Responds to questions appropriately with a normal affect.   Results for orders placed or performed in visit on 02/17/15  COMPLETE METABOLIC PANEL WITH GFR  Result Value Ref Range   Sodium 141 135 - 146 mmol/L   Potassium 3.9 3.5 - 5.3 mmol/L   Chloride 105 98 - 110 mmol/L   CO2 27 20 - 31 mmol/L   Glucose, Bld 92 70 - 99 mg/dL   BUN 15 7 - 25 mg/dL   Creat 0.66 0.60 - 0.93 mg/dL   Total Bilirubin 0.5 0.2 - 1.2 mg/dL   Alkaline Phosphatase 29 (L) 33 - 130 U/L   AST 14 10 - 35 U/L   ALT 16 6 - 29 U/L   Total Protein 6.5 6.1 - 8.1 g/dL   Albumin 4.2 3.6 - 5.1 g/dL   Calcium 9.2 8.6 - 10.4 mg/dL   GFR, Est African American >89 >=60 mL/min   GFR, Est Non African American >89 >=60 mL/min  Lipid panel  Result Value Ref Range   Cholesterol 156 125 - 200 mg/dL   Triglycerides 147 <150 mg/dL   HDL 52 >=46 mg/dL   Total CHOL/HDL Ratio 3.0 <=5.0 Ratio   VLDL 29 <30 mg/dL   LDL Cholesterol 75 <130 mg/dL  CBC with Differential/Platelet  Result Value Ref Range   WBC 9.3 4.0 - 10.5 K/uL   RBC 4.53 3.87 - 5.11 MIL/uL   Hemoglobin 13.4 12.0 - 15.0 g/dL   HCT 40.2 36.0 - 46.0 %   MCV 88.7 78.0 - 100.0 fL   MCH 29.6 26.0 - 34.0 pg   MCHC 33.3 30.0 - 36.0 g/dL   RDW 13.9 11.5 - 15.5 %   Platelets 398 150 - 400 K/uL   MPV 10.0 8.6 - 12.4 fL   Neutrophils Relative % 54 43 - 77 %   Neutro Abs 5.0 1.7 - 7.7 K/uL   Lymphocytes Relative 35 12 - 46 %   Lymphs Abs 3.3 0.7 - 4.0 K/uL   Monocytes Relative 8 3 - 12 %   Monocytes Absolute 0.7 0.1 - 1.0 K/uL   Eosinophils Relative 2 0 - 5 %   Eosinophils Absolute 0.2 0.0 - 0.7 K/uL   Basophils Relative 1 0 - 1 %   Basophils Absolute 0.1 0.0 - 0.1 K/uL   Smear Review Criteria for review not met   Vit D  25 hydroxy (rtn  osteoporosis monitoring)  Result Value Ref Range   Vit D, 25-Hydroxy 30 30 - 100 ng/mL  TSH  Result Value Ref Range  TSH 1.017 0.350 - 4.500 uIU/mL     ASSESSMENT AND PLAN:  69 y.o. year old female with  1. Hyperlipidemia She came recently fasting for labs. Lipid panel is excellent. Continue current medication. LFTs are also normal.  2. Hypertension Blood pressure is controlled. BMET normal.  Continue current medications without change.  3. Osteoporosis She started Fosamax March 2013. At past visit here I told her she should have a DEXA scan one year after starting therapy.  Therefore I placed an order for this at that visit. However, at [place] 07/2013 she stated that they told her that she had to wait 2 years to have a followup scan.  She said that she plans to have a followup DEXA scan when she does her her next mammogram. At OV--01/2014--she said that when she went for her mammogram she wanted to have her DEXA scan done.  However said that was told it she would have to have an order from [place].  Said that she called here for an order but never heard back about it so decided it wasn't important. At [place] 07/2014-- I told her that I could send an order right now and go ahead and get this done.  She wanted to wait and do it when she has her next mammogram. At [place] 08/14/2014-----I placed order for Mammo and DEXA--to be done same day together !!  At [place] 07/2013--I reviewed to her low vitamin D level above and his labs. To review her medication list. She is not on any calcium or vitamin D currently. Told her to start taking over-the-counter calcium approximately 1000-1200 mg daily. Told her to start taking vitamin D 4000 units daily. At OV--01/2014--she says that she took the calcium and vitamin D some but has not been taking it daily. I told her that she needs to put the calcium and vitamin D with her prescription medications and take them every single day. At [place] 2/16-- says she is [place] and D most  days--Discussed --again---take every day!!! At [place] 01/2015--- is that she is taking the calcium and vitamin D every single day as discussed at last visit.  She had follow-up DEXA scan performed 09/15/2014. Lumbar spine T score -1.5. Femur T score -2.8. Compared to study 08/2011, there was 8.6% increase in bone density in the lumbar spine, there was 3% increase in bone density in the left hip.   ---Continue the Fosamax, Calcium, and Vitamin D  4. Vitamin D deficiency She is now taking 4,000 units every day. Vitamin D level has increased from 24 to 30, which is now WNL. Told her to continue this current dose, every day.   5. screening mammogram:  She had mammogram 09/15/14. This required additional study which was performed 09/19/14 and was negative.  6. screening colonoscopy:   Patient is aware of risk versus benefits. However she still defers this.  Today I discussed Hemoccult cards as another screening tool. She defers this as well.    7. Immunizations: Pneumonia vaccine:  ------------Pneumovax 23--Given here 08/31/2011.   ------------Prevnar 13 ------Given here --01/2014 Zostavax: Patient defers. Even after long discussion she still defers. Tetanus--not covered by [facility].  ROV 6 months, sooner if needed.   Marin Olp [place], [place], [facility] 02/18/2015 12:29 PM

## 2015-04-16 ENCOUNTER — Other Ambulatory Visit: Payer: Self-pay | Admitting: Physician Assistant

## 2015-04-16 NOTE — Telephone Encounter (Signed)
Refill appropriate and filled per protocol. 

## 2015-05-12 ENCOUNTER — Other Ambulatory Visit: Payer: Self-pay | Admitting: Physician Assistant

## 2015-05-12 NOTE — Telephone Encounter (Signed)
Medication refilled per protocol. 

## 2015-06-21 DIAGNOSIS — S3992XA Unspecified injury of lower back, initial encounter: Secondary | ICD-10-CM

## 2015-06-21 HISTORY — DX: Unspecified injury of lower back, initial encounter: S39.92XA

## 2015-08-12 ENCOUNTER — Other Ambulatory Visit: Payer: BLUE CROSS/BLUE SHIELD

## 2015-08-12 DIAGNOSIS — I10 Essential (primary) hypertension: Secondary | ICD-10-CM

## 2015-08-12 DIAGNOSIS — E785 Hyperlipidemia, unspecified: Secondary | ICD-10-CM

## 2015-08-12 DIAGNOSIS — Z79899 Other long term (current) drug therapy: Secondary | ICD-10-CM

## 2015-08-12 LAB — COMPREHENSIVE METABOLIC PANEL
ALT: 16 U/L (ref 6–29)
AST: 14 U/L (ref 10–35)
Albumin: 4.3 g/dL (ref 3.6–5.1)
Alkaline Phosphatase: 35 U/L (ref 33–130)
BUN: 13 mg/dL (ref 7–25)
CALCIUM: 9.8 mg/dL (ref 8.6–10.4)
CHLORIDE: 103 mmol/L (ref 98–110)
CO2: 29 mmol/L (ref 20–31)
Creat: 0.84 mg/dL (ref 0.60–0.93)
Glucose, Bld: 95 mg/dL (ref 70–99)
Potassium: 3.9 mmol/L (ref 3.5–5.3)
Sodium: 142 mmol/L (ref 135–146)
TOTAL PROTEIN: 7.1 g/dL (ref 6.1–8.1)
Total Bilirubin: 0.5 mg/dL (ref 0.2–1.2)

## 2015-08-12 LAB — CBC WITH DIFFERENTIAL/PLATELET
Basophils Absolute: 0.1 10*3/uL (ref 0.0–0.1)
Basophils Relative: 1 % (ref 0–1)
Eosinophils Absolute: 0.2 10*3/uL (ref 0.0–0.7)
Eosinophils Relative: 2 % (ref 0–5)
HEMATOCRIT: 41.7 % (ref 36.0–46.0)
HEMOGLOBIN: 13.6 g/dL (ref 12.0–15.0)
LYMPHS ABS: 3.2 10*3/uL (ref 0.7–4.0)
LYMPHS PCT: 35 % (ref 12–46)
MCH: 28.9 pg (ref 26.0–34.0)
MCHC: 32.6 g/dL (ref 30.0–36.0)
MCV: 88.7 fL (ref 78.0–100.0)
MPV: 10.1 fL (ref 8.6–12.4)
Monocytes Absolute: 0.6 10*3/uL (ref 0.1–1.0)
Monocytes Relative: 7 % (ref 3–12)
NEUTROS ABS: 5.1 10*3/uL (ref 1.7–7.7)
NEUTROS PCT: 55 % (ref 43–77)
Platelets: 412 10*3/uL — ABNORMAL HIGH (ref 150–400)
RBC: 4.7 MIL/uL (ref 3.87–5.11)
RDW: 14.1 % (ref 11.5–15.5)
WBC: 9.2 10*3/uL (ref 4.0–10.5)

## 2015-08-12 LAB — LIPID PANEL
Cholesterol: 205 mg/dL — ABNORMAL HIGH (ref 125–200)
HDL: 51 mg/dL (ref >=46)
LDL Cholesterol: 113 mg/dL (ref <130)
TRIGLYCERIDES: 205 mg/dL — AB (ref <150)
Total CHOL/HDL Ratio: 4 Ratio (ref <=5.0)
VLDL: 41 mg/dL — ABNORMAL HIGH (ref <30)

## 2015-08-13 ENCOUNTER — Encounter: Payer: Self-pay | Admitting: Physician Assistant

## 2015-08-13 ENCOUNTER — Ambulatory Visit (INDEPENDENT_AMBULATORY_CARE_PROVIDER_SITE_OTHER): Payer: BLUE CROSS/BLUE SHIELD | Admitting: Physician Assistant

## 2015-08-13 VITALS — BP 124/66 | HR 76 | Temp 98.4°F | Resp 18 | Wt 162.0 lb

## 2015-08-13 DIAGNOSIS — E559 Vitamin D deficiency, unspecified: Secondary | ICD-10-CM | POA: Diagnosis not present

## 2015-08-13 DIAGNOSIS — E785 Hyperlipidemia, unspecified: Secondary | ICD-10-CM | POA: Diagnosis not present

## 2015-08-13 DIAGNOSIS — I10 Essential (primary) hypertension: Secondary | ICD-10-CM

## 2015-08-13 DIAGNOSIS — M810 Age-related osteoporosis without current pathological fracture: Secondary | ICD-10-CM | POA: Diagnosis not present

## 2015-08-13 MED ORDER — ALENDRONATE SODIUM 70 MG PO TABS: ORAL_TABLET | ORAL | Status: DC

## 2015-08-13 MED ORDER — LOSARTAN POTASSIUM-HCTZ 100-25 MG PO TABS: 1.0000 | ORAL_TABLET | Freq: Every day | ORAL | Status: DC

## 2015-08-13 MED ORDER — AMLODIPINE BESYLATE 5 MG PO TABS: 5.0000 mg | ORAL_TABLET | Freq: Every day | ORAL | Status: DC

## 2015-08-13 MED ORDER — SIMVASTATIN 20 MG PO TABS: ORAL_TABLET | ORAL | Status: DC

## 2015-08-13 NOTE — Progress Notes (Signed)
Patient ID: Miranda Ramirez MRN: [id number], DOB: [birth year], 70 y.o. Date of Encounter: '@DATE' @  Chief Complaint:  Chief Complaint  Patient presents with  . routine check up for med refills    90 day refills please    HPI: 70 y.o. year old female  Presents for f/u OV.  She was a patient of Dr. Harriett Rush in the past. She started coming to this office when Dr. Ree Edman was seeing patients here.  But then her prior PCP retired at about the same time that Dr. Harriett Rush retired.  She decided to continue to come here to this office for her followup medical care.  She had a complete physical exam with me March 2013. She has had regular office visits with me 07/2012 , 01/2013, 07/2013, 01/2014, 07/2014 and now 01/2015.  She has no complaints today and says that she's been feeling good.  At [place] 07/23/2015---says she just got back from cruise to [place]. At OV--01/2015--says that she just took her 2 grandchildren who are ages 63 and 29 on a [place] cruise as a last trip before starting back to school. At her OV 07/2014 she said she just back from [place]. At [place] 8/15--had just gotten back from [place], [facility] etc.   She says that she has been doing a lot of walking. Says that she "has always enjoyed walking but it is even more fun now that she has a fit bit" and she can monitor the number of steps she takes per day.  She is taking blood pressure medications as directed. Having no adverse effects including no LE edema. No lightheadedness.  She is taking simvastatin 20 mg. She is having no myalgias or other adverse effects.  She is taking Fosamax once a week. Having no jaw pain and no pain in the thigh. No other adv effects.  Says that she also is taking her calcium and vitamin D every single day as I had discussed with her at prior visit.   Past Medical History  Diagnosis Date  . Hyperlipidemia   . Hypertension   . Osteoporosis      Home Meds:  Outpatient Prescriptions Prior  to Visit  Medication Sig Dispense Refill  . alendronate (FOSAMAX) 70 MG tablet TAKE ONE TABLET BY MOUTH ONCE A WEEK 4 tablet 11  . amLODipine (NORVASC) 5 MG tablet Take 1 tablet (5 mg total) by mouth daily. 30 tablet 11  . calcium-vitamin D (OSCAL WITH D) 500-200 MG-UNIT per tablet Takes 1,000 mg Calcium daily. 30 tablet 11  . Cholecalciferol (VITAMIN D) 2000 UNITS CAPS Take 2 daily--4,000 IU Daily. 30 capsule 11  . losartan-hydrochlorothiazide (HYZAAR) 100-25 MG tablet TAKE ONE TABLET BY MOUTH ONCE DAILY 90 tablet 0  . simvastatin (ZOCOR) 20 MG tablet TAKE ONE TABLET BY MOUTH ONCE DAILY AT 6 PM 90 tablet 0   No facility-administered medications prior to visit.      Allergies: No Known Allergies  Social History   Social History  . Marital Status: Married    Spouse Name: N/A  . Number of Children: N/A  . Years of Education: N/A   Occupational History  . Not on file.   Social History Main Topics  . Smoking status: Never Smoker   . Smokeless tobacco: Never Used  . Alcohol Use: No  . Drug Use: No  . Sexual Activity: Not on file   Other Topics Concern  . Not on file   Social History Narrative    Family  History  Problem Relation Age of Onset  . Diabetes Sister   . COPD Sister   . Cancer Sister 48    kidney cancer     Review of Systems:  See HPI for pertinent ROS. All other ROS negative.    Physical Exam: Blood pressure 124/66, pulse 76, temperature 98.4 F (36.9 C), temperature source Oral, resp. rate 18, weight 162 lb (73.483 kg)., Body mass index is 28.7 kg/(m^2). General: WNWD WF. Appears in no acute distress. Neck: Supple. No thyromegaly. No lymphadenopathy. No carotid bruit. Lungs: Clear bilaterally to auscultation without wheezes, rales, or rhonchi. Breathing is unlabored. Heart: RRR with S1 S2. No murmurs, rubs, or gallops. Abdomen: Soft, non-tender, non-distended with normoactive bowel sounds. No hepatomegaly. No rebound/guarding. No obvious abdominal  masses. Musculoskeletal:  Strength and tone normal for age. Extremities/Skin: Warm and dry. No clubbing or cyanosis. No edema. No rashes or suspicious lesions. Neuro: Alert and oriented X 3. Moves all extremities spontaneously. Gait is normal. CNII-XII grossly in tact. Psych:  Responds to questions appropriately with a normal affect.   Results for orders placed or performed in visit on 08/12/15  CBC with Differential/Platelet  Result Value Ref Range   WBC 9.2 4.0 - 10.5 K/uL   RBC 4.70 3.87 - 5.11 MIL/uL   Hemoglobin 13.6 12.0 - 15.0 g/dL   HCT 41.7 36.0 - 46.0 %   MCV 88.7 78.0 - 100.0 fL   MCH 28.9 26.0 - 34.0 pg   MCHC 32.6 30.0 - 36.0 g/dL   RDW 14.1 11.5 - 15.5 %   Platelets 412 (H) 150 - 400 K/uL   MPV 10.1 8.6 - 12.4 fL   Neutrophils Relative % 55 43 - 77 %   Neutro Abs 5.1 1.7 - 7.7 K/uL   Lymphocytes Relative 35 12 - 46 %   Lymphs Abs 3.2 0.7 - 4.0 K/uL   Monocytes Relative 7 3 - 12 %   Monocytes Absolute 0.6 0.1 - 1.0 K/uL   Eosinophils Relative 2 0 - 5 %   Eosinophils Absolute 0.2 0.0 - 0.7 K/uL   Basophils Relative 1 0 - 1 %   Basophils Absolute 0.1 0.0 - 0.1 K/uL   Smear Review Criteria for review not met   Comprehensive metabolic panel  Result Value Ref Range   Sodium 142 135 - 146 mmol/L   Potassium 3.9 3.5 - 5.3 mmol/L   Chloride 103 98 - 110 mmol/L   CO2 29 20 - 31 mmol/L   Glucose, Bld 95 70 - 99 mg/dL   BUN 13 7 - 25 mg/dL   Creat 0.84 0.60 - 0.93 mg/dL   Total Bilirubin 0.5 0.2 - 1.2 mg/dL   Alkaline Phosphatase 35 33 - 130 U/L   AST 14 10 - 35 U/L   ALT 16 6 - 29 U/L   Total Protein 7.1 6.1 - 8.1 g/dL   Albumin 4.3 3.6 - 5.1 g/dL   Calcium 9.8 8.6 - 10.4 mg/dL  Lipid panel  Result Value Ref Range   Cholesterol 205 (H) 125 - 200 mg/dL   Triglycerides 205 (H) <150 mg/dL   HDL 51 >=46 mg/dL   Total CHOL/HDL Ratio 4.0 <=5.0 Ratio   VLDL 41 (H) <30 mg/dL   LDL Cholesterol 113 <130 mg/dL     ASSESSMENT AND PLAN:  70 y.o. year old female with    1. Hyperlipidemia She came recently fasting for labs. Lipid panel is excellent. Continue current medication. LFTs are also normal.  2. Hypertension Blood pressure is controlled. BMET normal.  Continue current medications without change.  3. Osteoporosis She started Fosamax March 2013.  She had follow-up DEXA scan performed 09/15/2014. Lumbar spine T score -1.5. Femur T score -2.8. Compared to study 08/2011, there was 8.6% increase in bone density in the lumbar spine, there was 3% increase in bone density in the left hip.  At [place] 01/2015, 07/2015--- says that she is taking the calcium and vitamin D every single day as discussed at last visit.  ---Continue the Fosamax, Calcium, and Vitamin D   4. Vitamin D deficiency She is now taking 4,000 units every day. Vitamin D level has increased from 24 to 30, which is now WNL. Told her to continue this current dose, every day.   5. screening mammogram:  She had mammogram 09/15/14. This required additional study which was performed 09/19/14 and was negative.  6. screening colonoscopy:   Patient is aware of risk versus benefits. However she still defers this.  Today I discussed Hemoccult cards, Cologuard as other screening tools. She defers this as well.    7. Immunizations: Pneumonia vaccine:  ------------Pneumovax 23--Given here 08/31/2011.   ------------Prevnar 13 ------Given here --01/2014 Zostavax: Patient defers. Even after long discussion she still defers. Tetanus--not covered by [facility].  ROV 6 months, sooner if needed.   [address] [place], [place], [facility] 08/13/2015 2:16 PM

## 2015-10-01 ENCOUNTER — Other Ambulatory Visit: Payer: Self-pay

## 2015-10-01 DIAGNOSIS — Z1231 Encounter for screening mammogram for malignant neoplasm of breast: Secondary | ICD-10-CM

## 2015-10-19 ENCOUNTER — Ambulatory Visit
Admission: RE | Admit: 2015-10-19 | Discharge: 2015-10-19 | Disposition: A | Discharge status: Home / Self Care | Payer: PPO | Source: Ambulatory Visit

## 2015-10-19 DIAGNOSIS — Z1231 Encounter for screening mammogram for malignant neoplasm of breast: Secondary | ICD-10-CM

## 2015-10-19 LAB — HM MAMMOGRAPHY

## 2015-10-23 ENCOUNTER — Encounter: Payer: Self-pay | Admitting: Family Medicine

## 2015-12-23 DIAGNOSIS — H524 Presbyopia: Secondary | ICD-10-CM | POA: Diagnosis not present

## 2016-01-08 DIAGNOSIS — R21 Rash and other nonspecific skin eruption: Secondary | ICD-10-CM | POA: Diagnosis not present

## 2016-02-12 ENCOUNTER — Other Ambulatory Visit: Payer: Self-pay

## 2016-02-12 DIAGNOSIS — M810 Age-related osteoporosis without current pathological fracture: Secondary | ICD-10-CM | POA: Diagnosis not present

## 2016-02-12 DIAGNOSIS — E785 Hyperlipidemia, unspecified: Secondary | ICD-10-CM | POA: Diagnosis not present

## 2016-02-12 DIAGNOSIS — E559 Vitamin D deficiency, unspecified: Secondary | ICD-10-CM

## 2016-02-12 DIAGNOSIS — Z79899 Other long term (current) drug therapy: Secondary | ICD-10-CM | POA: Diagnosis not present

## 2016-02-12 DIAGNOSIS — I10 Essential (primary) hypertension: Secondary | ICD-10-CM

## 2016-02-12 LAB — COMPLETE METABOLIC PANEL WITH GFR
ALT: 14 U/L (ref 6–29)
AST: 13 U/L (ref 10–35)
Albumin: 4 g/dL (ref 3.6–5.1)
Alkaline Phosphatase: 40 U/L (ref 33–130)
BUN: 14 mg/dL (ref 7–25)
CHLORIDE: 105 mmol/L (ref 98–110)
CO2: 26 mmol/L (ref 20–31)
CREATININE: 0.8 mg/dL (ref 0.60–0.93)
Calcium: 9.4 mg/dL (ref 8.6–10.4)
GFR, EST AFRICAN AMERICAN: 86 mL/min (ref >=60)
GFR, Est Non African American: 74 mL/min (ref >=60)
GLUCOSE: 90 mg/dL (ref 70–99)
Potassium: 4.1 mmol/L (ref 3.5–5.3)
SODIUM: 143 mmol/L (ref 135–146)
TOTAL PROTEIN: 6.6 g/dL (ref 6.1–8.1)
Total Bilirubin: 0.4 mg/dL (ref 0.2–1.2)

## 2016-02-12 LAB — CBC WITH DIFFERENTIAL/PLATELET
Basophils Absolute: 105 cells/uL (ref 0–200)
Basophils Relative: 1 %
EOS ABS: 210 {cells}/uL (ref 15–500)
EOS PCT: 2 %
HCT: 39 % (ref 35.0–45.0)
HEMOGLOBIN: 12.7 g/dL (ref 12.0–15.0)
LYMPHS ABS: 3570 {cells}/uL (ref 850–3900)
Lymphocytes Relative: 34 %
MCH: 28.9 pg (ref 27.0–33.0)
MCHC: 32.6 g/dL (ref 32.0–36.0)
MCV: 88.6 fL (ref 80.0–100.0)
MPV: 9.8 fL (ref 7.5–12.5)
Monocytes Absolute: 630 cells/uL (ref 200–950)
Monocytes Relative: 6 %
NEUTROS ABS: 5985 {cells}/uL (ref 1500–7800)
Neutrophils Relative %: 57 %
Platelets: 402 10*3/uL — ABNORMAL HIGH (ref 140–400)
RBC: 4.4 MIL/uL (ref 3.80–5.10)
RDW: 13.6 % (ref 11.0–15.0)
WBC: 10.5 10*3/uL (ref 3.8–10.8)

## 2016-02-12 LAB — LIPID PANEL
CHOL/HDL RATIO: 3.4 ratio (ref <=5.0)
Cholesterol: 187 mg/dL (ref 125–200)
HDL: 55 mg/dL (ref >=46)
LDL Cholesterol: 94 mg/dL (ref <130)
Triglycerides: 191 mg/dL — ABNORMAL HIGH (ref <150)
VLDL: 38 mg/dL — AB (ref <30)

## 2016-02-12 LAB — TSH: TSH: 2.18 mIU/L

## 2016-02-13 LAB — VITAMIN D 25 HYDROXY (VIT D DEFICIENCY, FRACTURES): VIT D 25 HYDROXY: 25 ng/mL — AB (ref 30–100)

## 2016-02-17 ENCOUNTER — Encounter: Payer: Self-pay | Admitting: Physician Assistant

## 2016-02-17 ENCOUNTER — Ambulatory Visit (INDEPENDENT_AMBULATORY_CARE_PROVIDER_SITE_OTHER): Payer: PPO | Admitting: Physician Assistant

## 2016-02-17 VITALS — BP 122/74 | HR 98 | Temp 98.0°F | Resp 18 | Wt 162.0 lb

## 2016-02-17 DIAGNOSIS — M810 Age-related osteoporosis without current pathological fracture: Secondary | ICD-10-CM | POA: Diagnosis not present

## 2016-02-17 DIAGNOSIS — E785 Hyperlipidemia, unspecified: Secondary | ICD-10-CM | POA: Diagnosis not present

## 2016-02-17 DIAGNOSIS — E559 Vitamin D deficiency, unspecified: Secondary | ICD-10-CM

## 2016-02-17 DIAGNOSIS — I10 Essential (primary) hypertension: Secondary | ICD-10-CM | POA: Diagnosis not present

## 2016-02-17 MED ORDER — SIMVASTATIN 20 MG PO TABS: ORAL_TABLET | ORAL | 1 refills | Status: AC

## 2016-02-17 MED ORDER — LOSARTAN POTASSIUM-HCTZ 100-25 MG PO TABS: 1.0000 | ORAL_TABLET | Freq: Every day | ORAL | 1 refills | Status: DC

## 2016-02-17 MED ORDER — AMLODIPINE BESYLATE 5 MG PO TABS: 5.0000 mg | ORAL_TABLET | Freq: Every day | ORAL | 2 refills | Status: AC

## 2016-02-17 MED ORDER — ALENDRONATE SODIUM 70 MG PO TABS: ORAL_TABLET | ORAL | 11 refills | Status: DC

## 2016-02-17 NOTE — Progress Notes (Signed)
Patient ID: Miranda Ramirez MRN: [id number], DOB: 14-Jun-1946, 70 y.o. Date of Encounter: _0 @  Chief Complaint:  Chief Complaint  Patient presents with  . Follow-up    lab results    HPI: 70 y.o. year old female  Presents for f/u OV.  She was a patient of Dr. Harriett Rush in the past. She started coming to this office when Dr. Ree Edman was seeing patients here.  But then her prior PCP retired at about the same time that Dr. Harriett Rush retired.  She decided to continue to come here to this office for her followup medical care.  She had a complete physical exam with me March 2013. She has had regular office visits with me 07/2012 , 01/2013, 07/2013, 01/2014, 07/2014 and now 01/2015.  She has no complaints today and says that she's been feeling good.  At [place] 07/23/2015---says she just got back from cruise to [place]. At OV--01/2015--says that she just took her 2 grandchildren who are ages 66 and 9 on a [place] cruise as a last trip before starting back to school. At her OV 07/2014 she said she just back from [place]. At [place] 8/15--had just gotten back from [place], [facility] etc.  At [place] 01/2016---She and her daughter and grand kids went to [place] in [place] recently.  She says that she has been doing a lot of walking. Says that she "has always enjoyed walking but it is even more fun now that she has a fit bit" and she can monitor the number of steps she takes per day. At [place] 01/2016 she says she has continued to do a lot of walking.  She is taking blood pressure medications as directed. Having no adverse effects including no LE edema. No lightheadedness.  She is taking simvastatin 20 mg. She is having no myalgias or other adverse effects.  She is taking Fosamax once a week. Having no jaw pain and no pain in the thigh. No other adv effects.  Says that she also is taking her calcium and vitamin D every single day as I had discussed with her at prior visit.   Past Medical History:    Diagnosis Date  . Hyperlipidemia   . Hypertension   . Osteoporosis      Home Meds:  Outpatient Medications Prior to Visit  Medication Sig Dispense Refill  . alendronate (FOSAMAX) 70 MG tablet TAKE ONE TABLET BY MOUTH ONCE A WEEK 12 tablet 11  . amLODipine (NORVASC) 5 MG tablet Take 1 tablet (5 mg total) by mouth daily. 90 tablet 2  . calcium-vitamin D (OSCAL WITH D) 500-200 MG-UNIT per tablet Takes 1,000 mg Calcium daily. 30 tablet 11  . Cholecalciferol (VITAMIN D) 2000 UNITS CAPS Take 2 daily--4,000 IU Daily. 30 capsule 11  . losartan-hydrochlorothiazide (HYZAAR) 100-25 MG tablet Take 1 tablet by mouth daily. 90 tablet 1  . simvastatin (ZOCOR) 20 MG tablet TAKE ONE TABLET BY MOUTH ONCE DAILY AT 6 PM 90 tablet 1   No facility-administered medications prior to visit.       Allergies: No Known Allergies  Social History   Social History  . Marital status: Married    Spouse name: N/A  . Number of children: N/A  . Years of education: N/A   Occupational History  . Not on file.   Social History Main Topics  . Smoking status: Never Smoker  . Smokeless tobacco: Never Used  . Alcohol use No  . Drug use: No  . Sexual activity:  Not on file   Other Topics Concern  . Not on file   Social History Narrative  . No narrative on file    Family History  Problem Relation Age of Onset  . Diabetes Sister   . COPD Sister   . Cancer Sister 47    kidney cancer     Review of Systems:  See HPI for pertinent ROS. All other ROS negative.    Physical Exam: Blood pressure 122/74, pulse 98, temperature 98 F (36.7 C), temperature source Oral, resp. rate 18, weight 162 lb (73.5 kg), SpO2 98 %., Body mass index is 28.7 kg/m. General: WNWD WF. Appears in no acute distress. Neck: Supple. No thyromegaly. No lymphadenopathy. No carotid bruit. Lungs: Clear bilaterally to auscultation without wheezes, rales, or rhonchi. Breathing is unlabored. Heart: RRR with S1 S2. No murmurs, rubs, or  gallops. Abdomen: Soft, non-tender, non-distended with normoactive bowel sounds. No hepatomegaly. No rebound/guarding. No obvious abdominal masses. Musculoskeletal:  Strength and tone normal for age. Extremities/Skin: Warm and dry. No clubbing or cyanosis. No edema. No rashes or suspicious lesions. Neuro: Alert and oriented X 3. Moves all extremities spontaneously. Gait is normal. CNII-XII grossly in tact. Psych:  Responds to questions appropriately with a normal affect.   Results for orders placed or performed in visit on 02/12/16  COMPLETE METABOLIC PANEL WITH GFR  Result Value Ref Range   Sodium 143 135 - 146 mmol/L   Potassium 4.1 3.5 - 5.3 mmol/L   Chloride 105 98 - 110 mmol/L   CO2 26 20 - 31 mmol/L   Glucose, Bld 90 70 - 99 mg/dL   BUN 14 7 - 25 mg/dL   Creat 0.80 0.60 - 0.93 mg/dL   Total Bilirubin 0.4 0.2 - 1.2 mg/dL   Alkaline Phosphatase 40 33 - 130 U/L   AST 13 10 - 35 U/L   ALT 14 6 - 29 U/L   Total Protein 6.6 6.1 - 8.1 g/dL   Albumin 4.0 3.6 - 5.1 g/dL   Calcium 9.4 8.6 - 10.4 mg/dL   GFR, Est African American 86 >=60 mL/min   GFR, Est Non African American 74 >=60 mL/min  TSH  Result Value Ref Range   TSH 2.18 mIU/L  Lipid panel  Result Value Ref Range   Cholesterol 187 125 - 200 mg/dL   Triglycerides 191 (H) <150 mg/dL   HDL 55 >=46 mg/dL   Total CHOL/HDL Ratio 3.4 <=5.0 Ratio   VLDL 38 (H) <30 mg/dL   LDL Cholesterol 94 <130 mg/dL  CBC with Differential/Platelet  Result Value Ref Range   WBC 10.5 3.8 - 10.8 K/uL   RBC 4.40 3.80 - 5.10 MIL/uL   Hemoglobin 12.7 12.0 - 15.0 g/dL   HCT 39.0 35.0 - 45.0 %   MCV 88.6 80.0 - 100.0 fL   MCH 28.9 27.0 - 33.0 pg   MCHC 32.6 32.0 - 36.0 g/dL   RDW 13.6 11.0 - 15.0 %   Platelets 402 (H) 140 - 400 K/uL   MPV 9.8 7.5 - 12.5 fL   Neutro Abs 5,985 1,500 - 7,800 cells/uL   Lymphs Abs 3,570 850 - 3,900 cells/uL   Monocytes Absolute 630 200 - 950 cells/uL   Eosinophils Absolute 210 15 - 500 cells/uL   Basophils  Absolute 105 0 - 200 cells/uL   Neutrophils Relative % 57 %   Lymphocytes Relative 34 %   Monocytes Relative 6 %   Eosinophils Relative 2 %   Basophils Relative  1 %   Smear Review Criteria for review not met   VITAMIN D 25 Hydroxy (Vit-D Deficiency, Fractures)  Result Value Ref Range   Vit D, 25-Hydroxy 25 (L) 30 - 100 ng/mL     ASSESSMENT AND PLAN:  70 y.o. year old female with  1. Hyperlipidemia She came recently fasting for labs. Lipid panel is excellent. Continue current medication. LFTs are also normal.  2. Hypertension Blood pressure is controlled. BMET normal.  Continue current medications without change.  3. Osteoporosis She started Fosamax March 2013.  She had follow-up DEXA scan performed 09/15/2014. Lumbar spine T score -1.5. Femur T score -2.8. Compared to study 08/2011, there was 8.6% increase in bone density in the lumbar spine, there was 3% increase in bone density in the left hip.  At [place] 01/2015, 07/2015., 01/2016--- says that she is taking the calcium and vitamin D every single day as discussed at last visit.  ---Continue the Fosamax, Calcium, and Vitamin D   4. Vitamin D deficiency She is now taking 4,000 units every day. Vitamin D level has increased from 24 to 30, which is now WNL. Told her to continue this current dose, every day.   5. screening mammogram:  She had mammogram 09/15/14. This required additional study which was performed 09/19/14 and was negative. She had mammogram 10/19/2015 which was negative.  6. screening colonoscopy:   Patient is aware of risk versus benefits. However she still defers this.  Today I discussed Hemoccult cards, Cologuard as other screening tools. She defers this as well.    7. Immunizations: Pneumonia vaccine:  ------------Pneumovax 23--Given here 08/31/2011.   ------------Prevnar 13 ------Given here --01/2014 Zostavax: Patient defers. Even after long discussion she still defers. Tetanus--not covered by  [facility].  ROV 6 months, sooner if needed.   [address] [place], [place], [facility] 02/17/2016 12:23 PM

## 2016-06-20 HISTORY — PX: OTHER SURGICAL HISTORY: SHX169

## 2016-08-09 ENCOUNTER — Encounter: Payer: Self-pay | Admitting: Internal Medicine

## 2016-08-09 DIAGNOSIS — M533 Sacrococcygeal disorders, not elsewhere classified: Secondary | ICD-10-CM | POA: Diagnosis not present

## 2016-08-09 DIAGNOSIS — M810 Age-related osteoporosis without current pathological fracture: Secondary | ICD-10-CM | POA: Diagnosis not present

## 2016-08-09 DIAGNOSIS — E7800 Pure hypercholesterolemia, unspecified: Secondary | ICD-10-CM | POA: Diagnosis not present

## 2016-08-09 DIAGNOSIS — E559 Vitamin D deficiency, unspecified: Secondary | ICD-10-CM | POA: Diagnosis not present

## 2016-08-09 DIAGNOSIS — K649 Unspecified hemorrhoids: Secondary | ICD-10-CM | POA: Diagnosis not present

## 2016-08-09 DIAGNOSIS — Z1211 Encounter for screening for malignant neoplasm of colon: Secondary | ICD-10-CM | POA: Diagnosis not present

## 2016-08-09 DIAGNOSIS — I10 Essential (primary) hypertension: Secondary | ICD-10-CM | POA: Diagnosis not present

## 2016-08-09 DIAGNOSIS — Z6829 Body mass index (BMI) 29.0-29.9, adult: Secondary | ICD-10-CM | POA: Diagnosis not present

## 2016-08-24 ENCOUNTER — Other Ambulatory Visit: Payer: Self-pay | Admitting: Family Medicine

## 2016-08-24 DIAGNOSIS — E2839 Other primary ovarian failure: Secondary | ICD-10-CM

## 2016-09-15 ENCOUNTER — Ambulatory Visit
Admission: RE | Admit: 2016-09-15 | Discharge: 2016-09-15 | Disposition: A | Discharge status: Home / Self Care | Payer: PPO | Source: Ambulatory Visit | Attending: Family Medicine | Admitting: Family Medicine

## 2016-09-15 DIAGNOSIS — Z780 Asymptomatic menopausal state: Secondary | ICD-10-CM | POA: Diagnosis not present

## 2016-09-15 DIAGNOSIS — M8589 Other specified disorders of bone density and structure, multiple sites: Secondary | ICD-10-CM | POA: Diagnosis not present

## 2016-09-15 DIAGNOSIS — E2839 Other primary ovarian failure: Secondary | ICD-10-CM

## 2016-09-27 ENCOUNTER — Ambulatory Visit (AMBULATORY_SURGERY_CENTER): Payer: Self-pay

## 2016-09-27 VITALS — Ht 63.0 in | Wt 165.4 lb

## 2016-09-27 DIAGNOSIS — Z1211 Encounter for screening for malignant neoplasm of colon: Secondary | ICD-10-CM

## 2016-09-27 MED ORDER — NA SULFATE-K SULFATE-MG SULF 17.5-3.13-1.6 GM/177ML PO SOLN: ORAL | 0 refills | Status: DC

## 2016-09-27 NOTE — Progress Notes (Signed)
Per pt, no allergies to soy or egg products.Pt not taking any weight loss meds or using  O2 at home. 

## 2016-09-29 ENCOUNTER — Encounter: Payer: Self-pay | Admitting: Internal Medicine

## 2016-10-05 HISTORY — PX: COLONOSCOPY: SHX174

## 2016-10-13 ENCOUNTER — Encounter: Payer: Self-pay | Admitting: Internal Medicine

## 2016-10-13 ENCOUNTER — Ambulatory Visit (AMBULATORY_SURGERY_CENTER): Payer: PPO | Admitting: Internal Medicine

## 2016-10-13 VITALS — BP 119/75 | HR 76 | Temp 97.1°F | Resp 13 | Ht 63.0 in | Wt 165.0 lb

## 2016-10-13 DIAGNOSIS — D122 Benign neoplasm of ascending colon: Secondary | ICD-10-CM | POA: Diagnosis not present

## 2016-10-13 DIAGNOSIS — Z1212 Encounter for screening for malignant neoplasm of rectum: Secondary | ICD-10-CM

## 2016-10-13 DIAGNOSIS — Z1211 Encounter for screening for malignant neoplasm of colon: Secondary | ICD-10-CM | POA: Diagnosis not present

## 2016-10-13 DIAGNOSIS — I10 Essential (primary) hypertension: Secondary | ICD-10-CM | POA: Diagnosis not present

## 2016-10-13 DIAGNOSIS — D123 Benign neoplasm of transverse colon: Secondary | ICD-10-CM | POA: Diagnosis not present

## 2016-10-13 MED ORDER — SODIUM CHLORIDE 0.9 % IV SOLN
500.0000 mL | INTRAVENOUS | Status: DC
Start: 2016-10-13 — End: 2019-11-04

## 2016-10-13 NOTE — Patient Instructions (Signed)
YOU HAD AN ENDOSCOPIC PROCEDURE TODAY AT [facility]:   Refer to the procedure report that was given to you for any specific questions about what was found during the examination.  If the procedure report does not answer your questions, please call your gastroenterologist to clarify.  If you requested that your care partner not be given the details of your procedure findings, then the procedure report has been included in a sealed envelope for you to review at your convenience later.  YOU SHOULD EXPECT: Some feelings of bloating in the abdomen. Passage of more gas than usual.  Walking can help get rid of the air that was put into your GI tract during the procedure and reduce the bloating. If you had a lower endoscopy (such as a colonoscopy or flexible sigmoidoscopy) you may notice spotting of blood in your stool or on the toilet paper. If you underwent a bowel prep for your procedure, you may not have a normal bowel movement for a few days.  Please Note:  You might notice some irritation and congestion in your nose or some drainage.  This is from the oxygen used during your procedure.  There is no need for concern and it should clear up in a day or so.  SYMPTOMS TO REPORT IMMEDIATELY:   Following lower endoscopy (colonoscopy or flexible sigmoidoscopy):  Excessive amounts of blood in the stool  Significant tenderness or worsening of abdominal pains  Swelling of the abdomen that is new, acute  Fever of 100F or higher    For urgent or emergent issues, a gastroenterologist can be reached at any hour by calling [phone].   DIET:  We do recommend a small meal at first, but then you may proceed to your regular diet.  Drink plenty of fluids but you should avoid alcoholic beverages for 24 hours.  ACTIVITY:  You should plan to take it easy for the rest of today and you should NOT DRIVE or use heavy machinery until tomorrow (because of the sedation medicines used during the test).     FOLLOW UP: Our staff will call the number listed on your records the next business day following your procedure to check on you and address any questions or concerns that you may have regarding the information given to you following your procedure. If we do not reach you, we will leave a message.  However, if you are feeling well and you are not experiencing any problems, there is no need to return our call.  We will assume that you have returned to your regular daily activities without incident.  If any biopsies were taken you will be contacted by phone or by letter within the next 1-3 weeks.  Please call us at [phone] if you have not heard about the biopsies in 3 weeks.    SIGNATURES/CONFIDENTIALITY: You and/or your care partner have signed paperwork which will be entered into your electronic medical record.  These signatures attest to the fact that that the information above on your After Visit Summary has been reviewed and is understood.  Full responsibility of the confidentiality of this discharge information lies with you and/or your care-partner.   INFORMATION ON POLYPS ,DIVERTICULOSIS,AND HEMORRHOIDS GIVEN TO YOU TODAY  REPEAT COLONOSCOPY IN 3-5 YEARS

## 2016-10-13 NOTE — Op Note (Signed)
[place] Patient Name: Miranda Ramirez Procedure Date: 10/13/2016 2:56 PM MRN: [id number] Endoscopist: Docia Chuck. Henrene Pastor , MD Age: 71 Referring MD:  Date of Birth: [birth year] Gender: Female Account #: [id number] Procedure:                Colonoscopy, with cold snare polypectomy x 6 Indications:              Screening for colorectal malignant neoplasm Medicines:                Monitored Anesthesia Care Procedure:                Pre-Anesthesia Assessment:                           - Prior to the procedure, a History and Physical                            was performed, and patient medications and                            allergies were reviewed. The patient's tolerance of                            previous anesthesia was also reviewed. The risks                            and benefits of the procedure and the sedation                            options and risks were discussed with the patient.                            All questions were answered, and informed consent                            was obtained. Prior Anticoagulants: The patient has                            taken no previous anticoagulant or antiplatelet                            agents. ASA Grade Assessment: II - A patient with                            mild systemic disease. After reviewing the risks                            and benefits, the patient was deemed in                            satisfactory condition to undergo the procedure.                           After obtaining informed consent, the colonoscope  was passed under direct vision. Throughout the                            procedure, the patient's blood pressure, pulse, and                            oxygen saturations were monitored continuously. The                            Model CF-HQ190L [id number]) scope was introduced                            through the anus and advanced to the the cecum,                  identified by appendiceal orifice and ileocecal                            valve. The ileocecal valve, appendiceal orifice,                            and rectum were photographed. The quality of the                            bowel preparation was excellent. The colonoscopy                            was performed without difficulty. The patient                            tolerated the procedure well. The bowel preparation                            used was SUPREP. Scope In: 3:13:59 PM Scope Out: 3:32:48 PM Scope Withdrawal Time: 0 hours 15 minutes 25 seconds  Total Procedure Duration: 0 hours 18 minutes 49 seconds  Findings:                 Six polyps were found in the transverse colon and                            ascending colon. The polyps were 1 to 4 mm in size.                            These polyps were removed with a cold snare.                            Resection and retrieval were complete.                           A few diverticula were found in the sigmoid colon.                           Internal hemorrhoids were found during retroflexion.  The exam was otherwise without abnormality on                            direct and retroflexion views. Complications:            No immediate complications. Estimated blood loss:                            None. Estimated Blood Loss:     Estimated blood loss: none. Impression:               - Six 1 to 4 mm polyps in the transverse colon and                            in the ascending colon, removed with a cold snare.                            Resected and retrieved.                           - Diverticulosis in the sigmoid colon.                           - Internal hemorrhoids.                           - The examination was otherwise normal on direct                            and retroflexion views. Recommendation:           - Repeat colonoscopy in 3 - 5 years for                             surveillance.                           - Patient has a contact number available for                            emergencies. The signs and symptoms of potential                            delayed complications were discussed with the                            patient. Return to normal activities tomorrow.                            Written discharge instructions were provided to the                            patient.                           - Resume previous diet.                           -  Continue present medications.                           - Await pathology results. Docia Chuck. Henrene Pastor, MD 10/13/2016 3:38:51 PM This report has been signed electronically.

## 2016-10-13 NOTE — Progress Notes (Signed)
Patient awakening,vss,report to rn 

## 2016-10-13 NOTE — Progress Notes (Signed)
Called to room to assist during endoscopic procedure.  Patient ID and intended procedure confirmed with present staff. Received instructions for my participation in the procedure from the performing physician.  

## 2016-10-14 ENCOUNTER — Telehealth: Payer: Self-pay

## 2016-10-14 NOTE — Telephone Encounter (Signed)
  Follow up Call-  Call back number 10/13/2016  Post procedure Call Back phone  # [phone]  Permission to leave phone message Yes  Some recent data might be hidden     Patient questions:  Do you have a fever, pain , or abdominal swelling? No. Pain Score  0 *  Have you tolerated food without any problems? Yes.    Have you been able to return to your normal activities? Yes.    Do you have any questions about your discharge instructions: Diet   No. Medications  No. Follow up visit  No.  Do you have questions or concerns about your Care? No.  Actions: * If pain score is 4 or above: No action needed, pain <4.

## 2016-10-17 ENCOUNTER — Other Ambulatory Visit: Payer: Self-pay | Admitting: Family Medicine

## 2016-10-17 DIAGNOSIS — Z1231 Encounter for screening mammogram for malignant neoplasm of breast: Secondary | ICD-10-CM

## 2016-10-18 ENCOUNTER — Encounter: Payer: Self-pay | Admitting: Internal Medicine

## 2016-10-18 DIAGNOSIS — H1045 Other chronic allergic conjunctivitis: Secondary | ICD-10-CM | POA: Diagnosis not present

## 2016-11-01 DIAGNOSIS — H43811 Vitreous degeneration, right eye: Secondary | ICD-10-CM | POA: Diagnosis not present

## 2016-11-02 ENCOUNTER — Ambulatory Visit
Admission: RE | Admit: 2016-11-02 | Discharge: 2016-11-02 | Disposition: A | Discharge status: Home / Self Care | Payer: PPO | Source: Ambulatory Visit | Attending: Family Medicine | Admitting: Family Medicine

## 2016-11-02 DIAGNOSIS — Z1231 Encounter for screening mammogram for malignant neoplasm of breast: Secondary | ICD-10-CM | POA: Diagnosis not present

## 2016-11-04 ENCOUNTER — Other Ambulatory Visit: Payer: Self-pay | Admitting: Family Medicine

## 2016-11-04 DIAGNOSIS — R928 Other abnormal and inconclusive findings on diagnostic imaging of breast: Secondary | ICD-10-CM

## 2016-11-09 ENCOUNTER — Ambulatory Visit
Admission: RE | Admit: 2016-11-09 | Discharge: 2016-11-09 | Disposition: A | Discharge status: Home / Self Care | Payer: PPO | Source: Ambulatory Visit | Attending: Family Medicine | Admitting: Family Medicine

## 2016-11-09 DIAGNOSIS — N6324 Unspecified lump in the left breast, lower inner quadrant: Secondary | ICD-10-CM | POA: Diagnosis not present

## 2016-11-09 DIAGNOSIS — N6323 Unspecified lump in the left breast, lower outer quadrant: Secondary | ICD-10-CM | POA: Diagnosis not present

## 2016-11-09 DIAGNOSIS — R928 Other abnormal and inconclusive findings on diagnostic imaging of breast: Secondary | ICD-10-CM

## 2017-02-17 DIAGNOSIS — H5203 Hypermetropia, bilateral: Secondary | ICD-10-CM | POA: Diagnosis not present

## 2017-02-17 DIAGNOSIS — H524 Presbyopia: Secondary | ICD-10-CM | POA: Diagnosis not present

## 2017-02-21 ENCOUNTER — Ambulatory Visit
Admission: RE | Admit: 2017-02-21 | Discharge: 2017-02-21 | Disposition: A | Discharge status: Home / Self Care | Payer: PPO | Source: Ambulatory Visit | Attending: Family Medicine | Admitting: Family Medicine

## 2017-02-21 ENCOUNTER — Other Ambulatory Visit: Payer: Self-pay | Admitting: Family Medicine

## 2017-02-21 DIAGNOSIS — Z1159 Encounter for screening for other viral diseases: Secondary | ICD-10-CM | POA: Diagnosis not present

## 2017-02-21 DIAGNOSIS — Z6828 Body mass index (BMI) 28.0-28.9, adult: Secondary | ICD-10-CM | POA: Diagnosis not present

## 2017-02-21 DIAGNOSIS — M533 Sacrococcygeal disorders, not elsewhere classified: Secondary | ICD-10-CM

## 2017-02-21 DIAGNOSIS — I10 Essential (primary) hypertension: Secondary | ICD-10-CM | POA: Diagnosis not present

## 2017-02-21 DIAGNOSIS — E7800 Pure hypercholesterolemia, unspecified: Secondary | ICD-10-CM | POA: Diagnosis not present

## 2017-02-21 DIAGNOSIS — Z0000 Encounter for general adult medical examination without abnormal findings: Secondary | ICD-10-CM | POA: Diagnosis not present

## 2017-02-21 DIAGNOSIS — Z79899 Other long term (current) drug therapy: Secondary | ICD-10-CM | POA: Diagnosis not present

## 2017-02-21 DIAGNOSIS — M810 Age-related osteoporosis without current pathological fracture: Secondary | ICD-10-CM | POA: Diagnosis not present

## 2017-02-21 DIAGNOSIS — J069 Acute upper respiratory infection, unspecified: Secondary | ICD-10-CM | POA: Diagnosis not present

## 2017-09-29 DIAGNOSIS — N390 Urinary tract infection, site not specified: Secondary | ICD-10-CM | POA: Diagnosis not present

## 2017-09-29 DIAGNOSIS — R300 Dysuria: Secondary | ICD-10-CM | POA: Diagnosis not present

## 2017-10-05 ENCOUNTER — Other Ambulatory Visit: Payer: Self-pay | Admitting: Family Medicine

## 2017-10-05 DIAGNOSIS — Z139 Encounter for screening, unspecified: Secondary | ICD-10-CM

## 2017-11-03 ENCOUNTER — Ambulatory Visit
Admission: RE | Admit: 2017-11-03 | Discharge: 2017-11-03 | Disposition: A | Discharge status: Home / Self Care | Payer: PPO | Source: Ambulatory Visit | Attending: Family Medicine | Admitting: Family Medicine

## 2017-11-03 DIAGNOSIS — Z139 Encounter for screening, unspecified: Secondary | ICD-10-CM

## 2017-11-03 DIAGNOSIS — Z1231 Encounter for screening mammogram for malignant neoplasm of breast: Secondary | ICD-10-CM | POA: Diagnosis not present

## 2018-01-27 DIAGNOSIS — K5792 Diverticulitis of intestine, part unspecified, without perforation or abscess without bleeding: Secondary | ICD-10-CM | POA: Diagnosis not present

## 2018-01-27 DIAGNOSIS — R1032 Left lower quadrant pain: Secondary | ICD-10-CM | POA: Diagnosis not present

## 2018-01-30 ENCOUNTER — Other Ambulatory Visit: Payer: Self-pay | Admitting: Family Medicine

## 2018-01-30 DIAGNOSIS — R109 Unspecified abdominal pain: Secondary | ICD-10-CM

## 2018-01-31 ENCOUNTER — Ambulatory Visit
Admission: RE | Admit: 2018-01-31 | Discharge: 2018-01-31 | Disposition: A | Discharge status: Home / Self Care | Payer: PPO | Source: Ambulatory Visit | Attending: Family Medicine | Admitting: Family Medicine

## 2018-01-31 DIAGNOSIS — K769 Liver disease, unspecified: Secondary | ICD-10-CM | POA: Diagnosis not present

## 2018-01-31 DIAGNOSIS — R109 Unspecified abdominal pain: Secondary | ICD-10-CM

## 2018-01-31 MED ORDER — IOPAMIDOL (ISOVUE-300) INJECTION 61%
100.0000 mL | Freq: Once | INTRAVENOUS | Status: AC | PRN
  Administered 2018-01-31: 100 mL via INTRAVENOUS

## 2018-02-06 DIAGNOSIS — N9489 Other specified conditions associated with female genital organs and menstrual cycle: Secondary | ICD-10-CM | POA: Diagnosis not present

## 2018-02-15 DIAGNOSIS — E876 Hypokalemia: Secondary | ICD-10-CM | POA: Diagnosis not present

## 2018-02-21 DIAGNOSIS — N9489 Other specified conditions associated with female genital organs and menstrual cycle: Secondary | ICD-10-CM | POA: Diagnosis not present

## 2018-02-21 DIAGNOSIS — N852 Hypertrophy of uterus: Secondary | ICD-10-CM | POA: Diagnosis not present

## 2018-02-26 DIAGNOSIS — Z7689 Persons encountering health services in other specified circumstances: Secondary | ICD-10-CM | POA: Diagnosis not present

## 2018-02-26 NOTE — Patient Instructions (Addendum)
Your procedure is scheduled on: Tuesday, 9/17  Enter through the Main Entrance of [facility] at: 10:30 am  Pick up the phone at the desk and dial [phone].  Call this number if you have problems the morning of surgery: [phone].  Remember: Do NOT eat food or Do NOT drink clear liquids (including water) after midnight Monday.  Take these medicines the morning of surgery with a SIP OF WATER: amlodipine, Simvastatin  Brush your teeth on the day of surgery.  Bring inhaler with you on day of surgery.  Do Not smoke on the day of surgery.  Stop herbal medications, vitamin supplements, Ibuprofen/NSAIDS at this time.  Do NOT wear jewelry (body piercing), metal hair clips/bobby pins, make-up, or nail polish. Do NOT wear lotions, powders, or perfumes.  You may wear deoderant. Do NOT shave for 48 hours prior to surgery. Do NOT bring valuables to the hospital. Contacts, dentures, or bridgework may not be worn into surgery.  have a responsible adult drive you home and stay with you for 24 hours after your procedure.H

## 2018-02-28 ENCOUNTER — Other Ambulatory Visit: Payer: Self-pay | Admitting: Obstetrics and Gynecology

## 2018-03-02 DIAGNOSIS — R197 Diarrhea, unspecified: Secondary | ICD-10-CM | POA: Diagnosis not present

## 2018-03-05 ENCOUNTER — Encounter (HOSPITAL_COMMUNITY): Payer: Self-pay

## 2018-03-05 ENCOUNTER — Encounter (HOSPITAL_COMMUNITY): Payer: Self-pay | Admitting: Anesthesiology

## 2018-03-05 ENCOUNTER — Other Ambulatory Visit: Payer: Self-pay

## 2018-03-05 ENCOUNTER — Encounter (HOSPITAL_COMMUNITY)
Admission: RE | Admit: 2018-03-05 | Discharge: 2018-03-05 | Disposition: A | Discharge status: Home / Self Care | Payer: PPO | Source: Ambulatory Visit | Attending: Obstetrics and Gynecology | Admitting: Obstetrics and Gynecology

## 2018-03-05 DIAGNOSIS — Z01818 Encounter for other preprocedural examination: Secondary | ICD-10-CM | POA: Diagnosis not present

## 2018-03-05 LAB — CBC
HCT: 39.5 % (ref 36.0–46.0)
HEMOGLOBIN: 13 g/dL (ref 12.0–15.0)
MCH: 28.8 pg (ref 26.0–34.0)
MCHC: 32.9 g/dL (ref 30.0–36.0)
MCV: 87.6 fL (ref 78.0–100.0)
Platelets: 455 10*3/uL — ABNORMAL HIGH (ref 150–400)
RBC: 4.51 MIL/uL (ref 3.87–5.11)
RDW: 13.4 % (ref 11.5–15.5)
WBC: 17.6 10*3/uL — ABNORMAL HIGH (ref 4.0–10.5)

## 2018-03-05 LAB — COMPREHENSIVE METABOLIC PANEL
ALK PHOS: 39 U/L (ref 38–126)
ALT: 23 U/L (ref 0–44)
ANION GAP: 12 (ref 5–15)
AST: 33 U/L (ref 15–41)
Albumin: 3.5 g/dL (ref 3.5–5.0)
BILIRUBIN TOTAL: 0.5 mg/dL (ref 0.3–1.2)
BUN: 12 mg/dL (ref 8–23)
CALCIUM: 9.3 mg/dL (ref 8.9–10.3)
CO2: 29 mmol/L (ref 22–32)
CREATININE: 1.12 mg/dL — AB (ref 0.44–1.00)
Chloride: 104 mmol/L (ref 98–111)
GFR calc non Af Amer: 48 mL/min — ABNORMAL LOW (ref >60)
GFR, EST AFRICAN AMERICAN: 55 mL/min — AB (ref >60)
Glucose, Bld: 118 mg/dL — ABNORMAL HIGH (ref 70–99)
Potassium: 2.9 mmol/L — ABNORMAL LOW (ref 3.5–5.1)
SODIUM: 145 mmol/L (ref 135–145)
Total Protein: 6.7 g/dL (ref 6.5–8.1)

## 2018-03-05 NOTE — Anesthesia Preprocedure Evaluation (Deleted)
Anesthesia Evaluation  Patient identified by MRN, date of birth, ID band Patient awake    Reviewed: Allergy & Precautions, NPO status , Patient's Chart, lab work & pertinent test results  Airway        Dental   Pulmonary neg pulmonary ROS,           Cardiovascular Exercise Tolerance: Good hypertension, Pt. on medications      Neuro/Psych negative neurological ROS  negative psych ROS   GI/Hepatic negative GI ROS,   Endo/Other    Renal/GU      Musculoskeletal negative musculoskeletal ROS (+)   Abdominal   Peds  Hematology   Anesthesia Other Findings   Reproductive/Obstetrics                             Lab Results  Component Value Date   WBC 10.5 02/12/2016   HGB 12.7 02/12/2016   HCT 39.0 02/12/2016   MCV 88.6 02/12/2016   PLT 402 (H) 02/12/2016    Anesthesia Physical Anesthesia Plan  ASA: II  Anesthesia Plan: General   Post-op Pain Management:    Induction: Intravenous  PONV Risk Score and Plan: 3 and Treatment may vary due to age or medical condition, Dexamethasone and Ondansetron  Airway Management Planned: LMA  Additional Equipment:   Intra-op Plan:   Post-operative Plan:   Informed Consent: I have reviewed the patients History and Physical, chart, labs and discussed the procedure including the risks, benefits and alternatives for the proposed anesthesia with the patient or authorized representative who has indicated his/her understanding and acceptance.   Dental advisory given  Plan Discussed with:   Anesthesia Plan Comments:         Anesthesia Quick Evaluation

## 2018-03-05 NOTE — Pre-Procedure Instructions (Signed)
DR. Cletus Gash MILLER AWARE OF K 2.9. INSTRUCTED TO CALL DR. COLE TO LET HER KNOW PATIENT COULD BE CANCELLED TOMORROW DEPENDING ON WHO THE ANESTHESIA DOCTOR IS

## 2018-03-06 ENCOUNTER — Ambulatory Visit (HOSPITAL_COMMUNITY): Admission: AD | Admit: 2018-03-06 | Payer: PPO | Source: Ambulatory Visit | Admitting: Obstetrics and Gynecology

## 2018-03-06 ENCOUNTER — Encounter (HOSPITAL_COMMUNITY): Admission: AD | Payer: Self-pay | Source: Ambulatory Visit

## 2018-03-06 SURGERY — DILATATION & CURETTAGE/HYSTEROSCOPY WITH MYOSURE
Anesthesia: Choice

## 2018-03-23 DIAGNOSIS — E7800 Pure hypercholesterolemia, unspecified: Secondary | ICD-10-CM | POA: Diagnosis not present

## 2018-03-23 DIAGNOSIS — E559 Vitamin D deficiency, unspecified: Secondary | ICD-10-CM | POA: Diagnosis not present

## 2018-03-23 DIAGNOSIS — Z0000 Encounter for general adult medical examination without abnormal findings: Secondary | ICD-10-CM | POA: Diagnosis not present

## 2018-03-23 DIAGNOSIS — Z79899 Other long term (current) drug therapy: Secondary | ICD-10-CM | POA: Diagnosis not present

## 2018-03-23 DIAGNOSIS — Z23 Encounter for immunization: Secondary | ICD-10-CM | POA: Diagnosis not present

## 2018-03-23 DIAGNOSIS — A0472 Enterocolitis due to Clostridium difficile, not specified as recurrent: Secondary | ICD-10-CM | POA: Diagnosis not present

## 2018-03-23 DIAGNOSIS — M810 Age-related osteoporosis without current pathological fracture: Secondary | ICD-10-CM | POA: Diagnosis not present

## 2018-03-23 DIAGNOSIS — I10 Essential (primary) hypertension: Secondary | ICD-10-CM | POA: Diagnosis not present

## 2018-03-23 NOTE — Patient Instructions (Addendum)
Your procedure is scheduled on: Wednesday, 10/16  Enter through the Main Entrance of [facility] at: 2 pm  Pick up the phone at the desk and dial [phone].  Call this number if you have problems the morning of surgery: [phone].  Remember: Do NOT eat food after midnight Tuesday  Do NOT drink clear liquids (including water) after 8:30 am Wednesday, day of surgery  Take these medicines the morning of surgery with a SIP OF WATER: amlodipine and simvastatin  Brush your teeth on the day of surgery.  Stop herbal medications, vitamin supplements, Ibuprofen/NSAIDS at this time.  Do NOT wear jewelry (body piercing), metal hair clips/bobby pins, make-up, or nail polish. Do NOT wear lotions, powders, or perfumes.  You may wear deoderant. Do NOT shave for 48 hours prior to surgery. Do NOT bring valuables to the hospital. Dentures/bridgework may not be worn into surgery.  Have a responsible adult drive you home and stay with you for 24 hours after your procedure.  Home with Husband Joe cell [phone]

## 2018-03-28 ENCOUNTER — Encounter (HOSPITAL_COMMUNITY): Payer: Self-pay

## 2018-03-28 ENCOUNTER — Other Ambulatory Visit: Payer: Self-pay

## 2018-03-28 ENCOUNTER — Encounter (HOSPITAL_COMMUNITY)
Admission: RE | Admit: 2018-03-28 | Discharge: 2018-03-28 | Disposition: A | Discharge status: Home / Self Care | Payer: PPO | Source: Ambulatory Visit | Attending: Obstetrics and Gynecology | Admitting: Obstetrics and Gynecology

## 2018-03-28 DIAGNOSIS — Z01812 Encounter for preprocedural laboratory examination: Secondary | ICD-10-CM | POA: Insufficient documentation

## 2018-03-28 LAB — CBC
HCT: 38.7 % (ref 36.0–46.0)
Hemoglobin: 12.5 g/dL (ref 12.0–15.0)
MCH: 28.9 pg (ref 26.0–34.0)
MCHC: 32.3 g/dL (ref 30.0–36.0)
MCV: 89.6 fL (ref 80.0–100.0)
Platelets: 411 10*3/uL — ABNORMAL HIGH (ref 150–400)
RBC: 4.32 MIL/uL (ref 3.87–5.11)
RDW: 14.2 % (ref 11.5–15.5)
WBC: 11.4 10*3/uL — ABNORMAL HIGH (ref 4.0–10.5)
nRBC: 0 % (ref 0.0–0.2)

## 2018-03-28 LAB — BASIC METABOLIC PANEL
Anion gap: 9 (ref 5–15)
BUN: 16 mg/dL (ref 8–23)
CO2: 27 mmol/L (ref 22–32)
CREATININE: 0.79 mg/dL (ref 0.44–1.00)
Calcium: 9.3 mg/dL (ref 8.9–10.3)
Chloride: 108 mmol/L (ref 98–111)
GFR calc Af Amer: >60 mL/min (ref >60)
GLUCOSE: 106 mg/dL — AB (ref 70–99)
Potassium: 3.9 mmol/L (ref 3.5–5.1)
SODIUM: 144 mmol/L (ref 135–145)

## 2018-03-28 NOTE — Progress Notes (Signed)
Patient had surgical soap at home from a previous PAT appt in 02/2018 (surgery was cancelled).

## 2018-04-02 DIAGNOSIS — N9489 Other specified conditions associated with female genital organs and menstrual cycle: Secondary | ICD-10-CM | POA: Diagnosis not present

## 2018-04-02 DIAGNOSIS — A0472 Enterocolitis due to Clostridium difficile, not specified as recurrent: Secondary | ICD-10-CM | POA: Diagnosis not present

## 2018-04-04 ENCOUNTER — Ambulatory Visit (HOSPITAL_COMMUNITY)
Admission: AD | Admit: 2018-04-04 | Discharge: 2018-04-04 | Disposition: A | Discharge status: Home / Self Care | Payer: PPO | Source: Ambulatory Visit | Attending: Obstetrics and Gynecology | Admitting: Obstetrics and Gynecology

## 2018-04-04 ENCOUNTER — Other Ambulatory Visit: Payer: Self-pay | Admitting: Obstetrics and Gynecology

## 2018-04-04 ENCOUNTER — Encounter (HOSPITAL_COMMUNITY): Payer: Self-pay

## 2018-04-04 ENCOUNTER — Ambulatory Visit (HOSPITAL_COMMUNITY): Payer: PPO | Admitting: Anesthesiology

## 2018-04-04 ENCOUNTER — Other Ambulatory Visit: Payer: Self-pay

## 2018-04-04 ENCOUNTER — Encounter (HOSPITAL_COMMUNITY)
Admission: AD | Disposition: A | Discharge status: Home / Self Care | Payer: Self-pay | Source: Ambulatory Visit | Attending: Obstetrics and Gynecology

## 2018-04-04 DIAGNOSIS — Z79899 Other long term (current) drug therapy: Secondary | ICD-10-CM | POA: Diagnosis not present

## 2018-04-04 DIAGNOSIS — N840 Polyp of corpus uteri: Secondary | ICD-10-CM | POA: Diagnosis not present

## 2018-04-04 DIAGNOSIS — Z7983 Long term (current) use of bisphosphonates: Secondary | ICD-10-CM | POA: Diagnosis not present

## 2018-04-04 DIAGNOSIS — E7800 Pure hypercholesterolemia, unspecified: Secondary | ICD-10-CM | POA: Insufficient documentation

## 2018-04-04 DIAGNOSIS — M810 Age-related osteoporosis without current pathological fracture: Secondary | ICD-10-CM | POA: Insufficient documentation

## 2018-04-04 DIAGNOSIS — I10 Essential (primary) hypertension: Secondary | ICD-10-CM | POA: Diagnosis not present

## 2018-04-04 DIAGNOSIS — N858 Other specified noninflammatory disorders of uterus: Secondary | ICD-10-CM | POA: Diagnosis not present

## 2018-04-04 HISTORY — PX: DILATATION & CURETTAGE/HYSTEROSCOPY WITH MYOSURE: SHX6511

## 2018-04-04 SURGERY — DILATATION & CURETTAGE/HYSTEROSCOPY WITH MYOSURE
Anesthesia: General | Site: Vagina

## 2018-04-04 MED ORDER — DEXAMETHASONE SODIUM PHOSPHATE 10 MG/ML IJ SOLN
INTRAMUSCULAR | Status: DC | PRN
  Administered 2018-04-04: 4 mg via INTRAVENOUS

## 2018-04-04 MED ORDER — ONDANSETRON HCL 4 MG/2ML IJ SOLN
INTRAMUSCULAR | Status: DC | PRN
  Administered 2018-04-04: 4 mg via INTRAVENOUS

## 2018-04-04 MED ORDER — PROPOFOL 10 MG/ML IV BOLUS
INTRAVENOUS | Status: AC
  Filled 2018-04-04: qty 20

## 2018-04-04 MED ORDER — MIDAZOLAM HCL 2 MG/2ML IJ SOLN
INTRAMUSCULAR | Status: AC
  Filled 2018-04-04: qty 2

## 2018-04-04 MED ORDER — LIDOCAINE HCL (CARDIAC) PF 100 MG/5ML IV SOSY
PREFILLED_SYRINGE | INTRAVENOUS | Status: DC | PRN
  Administered 2018-04-04: 40 mg via INTRAVENOUS
  Administered 2018-04-04: 60 mg via INTRAVENOUS

## 2018-04-04 MED ORDER — OXYCODONE HCL 5 MG/5ML PO SOLN: 5.0000 mg | Freq: Once | ORAL | Status: DC | PRN

## 2018-04-04 MED ORDER — FENTANYL CITRATE (PF) 100 MCG/2ML IJ SOLN
INTRAMUSCULAR | Status: AC
  Filled 2018-04-04: qty 2

## 2018-04-04 MED ORDER — ACETAMINOPHEN 160 MG/5ML PO SOLN: 325.0000 mg | ORAL | Status: DC | PRN

## 2018-04-04 MED ORDER — EPHEDRINE 5 MG/ML INJ
INTRAVENOUS | Status: AC
  Filled 2018-04-04: qty 10

## 2018-04-04 MED ORDER — IBUPROFEN 600 MG PO TABS: 600.0000 mg | ORAL_TABLET | Freq: Four times a day (QID) | ORAL | 0 refills | Status: AC | PRN

## 2018-04-04 MED ORDER — FENTANYL CITRATE (PF) 100 MCG/2ML IJ SOLN: 25.0000 ug | INTRAMUSCULAR | Status: DC | PRN

## 2018-04-04 MED ORDER — ONDANSETRON HCL 4 MG/2ML IJ SOLN
INTRAMUSCULAR | Status: AC
  Filled 2018-04-04: qty 2

## 2018-04-04 MED ORDER — BUPIVACAINE HCL (PF) 0.25 % IJ SOLN
INTRAMUSCULAR | Status: DC | PRN
  Administered 2018-04-04: 20 mL

## 2018-04-04 MED ORDER — ACETAMINOPHEN 325 MG PO TABS: 325.0000 mg | ORAL_TABLET | ORAL | Status: DC | PRN

## 2018-04-04 MED ORDER — LIDOCAINE HCL (CARDIAC) PF 100 MG/5ML IV SOSY
PREFILLED_SYRINGE | INTRAVENOUS | Status: AC
  Filled 2018-04-04: qty 5

## 2018-04-04 MED ORDER — MEPERIDINE HCL 25 MG/ML IJ SOLN: 6.2500 mg | INTRAMUSCULAR | Status: DC | PRN

## 2018-04-04 MED ORDER — LACTATED RINGERS IV SOLN
INTRAVENOUS | Status: DC
  Administered 2018-04-04: 15:00:00 via INTRAVENOUS

## 2018-04-04 MED ORDER — PHENYLEPHRINE 40 MCG/ML (10ML) SYRINGE FOR IV PUSH (FOR BLOOD PRESSURE SUPPORT)
PREFILLED_SYRINGE | INTRAVENOUS | Status: AC
  Filled 2018-04-04: qty 10

## 2018-04-04 MED ORDER — ONDANSETRON HCL 4 MG/2ML IJ SOLN: 4.0000 mg | Freq: Once | INTRAMUSCULAR | Status: DC | PRN

## 2018-04-04 MED ORDER — SODIUM CHLORIDE 0.9 % IR SOLN
Status: DC | PRN
  Administered 2018-04-04: 3000 mL

## 2018-04-04 MED ORDER — PHENYLEPHRINE HCL 10 MG/ML IJ SOLN
INTRAMUSCULAR | Status: DC | PRN
  Administered 2018-04-04: 80 ug via INTRAVENOUS

## 2018-04-04 MED ORDER — HYDROCODONE-ACETAMINOPHEN 5-325 MG PO TABS: 1.0000 | ORAL_TABLET | Freq: Four times a day (QID) | ORAL | 0 refills | Status: DC | PRN

## 2018-04-04 MED ORDER — OXYCODONE HCL 5 MG PO TABS: 5.0000 mg | ORAL_TABLET | Freq: Once | ORAL | Status: DC | PRN

## 2018-04-04 MED ORDER — BUPIVACAINE HCL (PF) 0.25 % IJ SOLN
INTRAMUSCULAR | Status: AC
  Filled 2018-04-04: qty 30

## 2018-04-04 MED ORDER — FENTANYL CITRATE (PF) 100 MCG/2ML IJ SOLN
INTRAMUSCULAR | Status: DC | PRN
  Administered 2018-04-04 (×2): 50 ug via INTRAVENOUS

## 2018-04-04 MED ORDER — PROPOFOL 10 MG/ML IV BOLUS
INTRAVENOUS | Status: DC | PRN
  Administered 2018-04-04: 150 mg via INTRAVENOUS
  Administered 2018-04-04 (×2): 50 mg via INTRAVENOUS

## 2018-04-04 SURGICAL SUPPLY — 18 items
CANISTER SUCT 3000ML PPV (MISCELLANEOUS) ×4 IMPLANT
CATH ROBINSON RED A/P 16FR (CATHETERS) ×4 IMPLANT
DEVICE MYOSURE LITE (MISCELLANEOUS) IMPLANT
DEVICE MYOSURE REACH (MISCELLANEOUS) IMPLANT
DILATOR CANAL MILEX (MISCELLANEOUS) ×2 IMPLANT
FILTER ARTHROSCOPY CONVERTOR (FILTER) ×4 IMPLANT
GLOVE BIOGEL M 6.5 STRL (GLOVE) ×4 IMPLANT
GLOVE BIOGEL PI IND STRL 6.5 (GLOVE) ×2 IMPLANT
GLOVE BIOGEL PI IND STRL 7.0 (GLOVE) ×2 IMPLANT
GLOVE BIOGEL PI INDICATOR 6.5 (GLOVE) ×2
GLOVE BIOGEL PI INDICATOR 7.0 (GLOVE) ×2
GOWN STRL REUS W/TWL LRG LVL3 (GOWN DISPOSABLE) ×8 IMPLANT
PACK VAGINAL MINOR WOMEN LF (CUSTOM PROCEDURE TRAY) ×4 IMPLANT
PAD OB MATERNITY 4.3X12.25 (PERSONAL CARE ITEMS) ×4 IMPLANT
SEAL ROD LENS SCOPE MYOSURE (ABLATOR) ×4 IMPLANT
TOWEL OR 17X24 6PK STRL BLUE (TOWEL DISPOSABLE) ×8 IMPLANT
TUBING AQUILEX INFLOW (TUBING) ×4 IMPLANT
TUBING AQUILEX OUTFLOW (TUBING) ×4 IMPLANT

## 2018-04-04 NOTE — Anesthesia Preprocedure Evaluation (Deleted)

## 2018-04-04 NOTE — H&P (Signed)
Date of Initial H&P: 04/04/2018  History reviewed, patient examined, no change in status, stable for surgery.

## 2018-04-04 NOTE — H&P (Signed)
Chief Complaint(s):      endometrial mass       HPI:          General          72 y/o presents for preop history and physical exam in preparation for hysteroscopy D&C and removal of endometrial mass with myosure. endometrial mass seen on CT Scan 01/31/2018. The CT scan was performed to evaluate abdominal pain on the right side. 4 wks prior she experienced a sharp pain in the left side she had never had before. she experienced discomfort bilaterally August 9th. when returning from a cruise. she went to the [facility] and was diagnosed with diverticulitis. she was placed on 2 antibiotics. she was told to follow up with primary care doctor. Dr. Stephanie Acre ordered a CT scan. Her ct scan 01/31/2018 reveled heterogeneity of the central uterus. an endometrial mass could not be excluded. Pelvic ultrasound was recommened for further evaluation.             she denies any postmenopausal bleeding. she has never had any hormone replacement therapy.             she is not sexual active due to her husbands erectile dysfunction. she has not had sex in over 2 years. she denies any pelvic pain.             Her ultrasound 02/21/2018 reveals. 6.5 cm x 4.2 cm x 4.5 cm uterus. the endometrium is 1.33 cm. It containes a hyperechoic mass in the fundal wall that is 2.4 cm x 1.9 cm x 1.3 cm. multiple tiny cystic areas are seen within the mass. Blood flow is noted. the right ovary contains a ?hyperechoic mass 0.8cm avascular. the left ovary is within normal limits. no adnexal masses are seen.     Current Medication:       Taking   Vancomycin HCl 250 MG Solution Reconstituted as directed Orally mix IV powder in 30 ml water and give PO. 125 mg PO 4x a day      Losartan Potassium 100 MG Tablet 1 tablet Orally Once a day      Amlodipine Besylate 5 MG Tablet 1 tablet Orally Once a day      Calcium 600-200 MG-UNIT Tablet 2 tablet Orally Once a day      Simvastatin 20 MG Tablet 1 tablet in the evening  Orally Once a day      Vitamin D3 5000 UNIT Capsule 1 capsule Orally Once a day      Medication List reviewed and reconciled with the patient        Medical History:   osteoporosis, Fosamax since March 2013      hypercholesterolemia      HTN      Past PCP: Ms. Virgel Gess, [place] (under Dr. Cletus Gash Pickard)/[facility] [place] [facility]      GI: Dr. Scarlette Shorts      Vit D Def      diverticulitis      CT abd/pelvis 01/2018: ?uterine mass      +C. diff 02/2018       Allergies/Intolerance:   N.K.D.A.       Gyn History:   Sexual activity not currently sexually active.   Periods : postmenopausal.   LMP 20+ years ago.   Last pap smear date years ago .   Last mammogram date 11/03/17-normal.        OB History:  Number of pregnancies  2.   Pregnancy # 1  live birth, girl, vaginal delivery.   Pregnancy # 2  live birth, girl, vaginal delivery.        Surgical History:   colonoscopy       Hospitalization:   Childbirth only       Family History:   Father: deceased, Died 1976- Unknown causes    Mother: deceased, Died of Asian Flu 48    Brother 1: alive    Sister 1: alive    1 brother(s) , 1 sister(s) . 2daughter(s) .          1 half Brother deceased - Heart Attack.     Social History:       General         Alcohol: Rare.           Children: 2, daughter (s).           Caffeine: coffee, 2+ servings daily, 1 tea at night.           DIET: regular.           Tobacco use cigarettes:  Former smoker, Quit in year  1977, Tobacco history last updated  04/02/2018.           Marital Status: married.           OCCUPATION: Retired 2017- age 72.           Exercise: Walking.       ROS:       CONSTITUTIONAL         Chills  No.  Fatigue  No.  Fever  No.  Night sweats  No.  Recent travel outside [place]  No.  Sweats  No.  Weight change  No.         OPHTHALMOLOGY         Blurring of vision  no.  Change in vision  no.  Double vision  no.         ENT         Dizziness  no.  Nose  bleeds  no.  Sore throat  no.  Teeth pain  no.         ALLERGY         Hives  no.         CARDIOLOGY         Chest pain  no.  High blood pressure  no.  Irregular heart beat  no.  Leg edema  no.  Palpitations  no.         RESPIRATORY         Shortness of breath  no.  Cough  no.  Wheezing  no.         UROLOGY         Pain with urination  no.  Urinary urgency  no.  Urinary frequency  no.  Urinary incontinence  no.  Difficulty urinating  No.  Blood in urine  No.         GASTROENTEROLOGY         Abdominal pain  no.  Appetite change  no.  Bloating/belching  no.  Blood in stool or on toilet paper  no.  Change in bowel movements  no.  Constipation  no.  Diarrhea  no.  Difficulty swallowing  no.  Nausea  no.         FEMALE REPRODUCTIVE         Vulvar pain  no.  Vulvar rash  no.  Abnormal vaginal bleeding  no.  Breast pain  no.  Nipple discharge  no.  Pain with intercourse  no.  Pelvic pain  no.  Unusual vaginal discharge  no.  Vaginal itching  no.         MUSCULOSKELETAL         Muscle aches  no.         NEUROLOGY         Headache  no.  Tingling/numbness  no.  Weakness  no.         PSYCHOLOGY         Depression  no.  Anxiety  no.  Nervousness  no.  Sleep disturbances  no.  Suicidal ideation  no .         ENDOCRINOLOGY         Excessive thirst  no.  Excessive urination  no.  Hair loss  no.  Heat or cold intolerance  no.         HEMATOLOGY/LYMPH         Abnormal bleeding  no.  Easy bruising  no.  Swollen glands  no.         DERMATOLOGY         New/changing skin lesion  no.  Rash  no.  Sores  no.             Negative except as stated in HPI.   Objective:    Vitals:        Wt 156, Wt change 5 lb, Ht 62.5, BMI 28.08, Pulse sitting 80, BP sitting 120/68     Past Results:    Examination:          General Examination         CONSTITUTIONAL: alert, oriented, NAD .          SKIN:  moist, warm.          EYES:  Conjunctiva clear.          LUNGS: clear to auscultation bilaterally, no  wheezes, rhonchi, rales.          HEART:  regular sinus rhythm, regular rate and rhythm.          ABDOMEN: soft, non-tender/non-distended, bowel sounds present .          FEMALE GENITOURINARY: normal external genitalia, labia - unremarkable, vagina - pink moist mucosa, no lesions or abnormal discharge, cervix - no discharge or lesions or CMT, adnexa - no masses or tenderness, uterus - nontender and normal size on palpation .          PSYCH:  affect normal, good eye contact.      Physical Examination:       Chaperone present          Chaperone present for pelvic exam, Chapman,Courtney 04/02/2018 .       Assessment:     Assessment:    Endometrial mass - N94.89 (Primary)      C. difficile colitis - A04.72              see details under Preventive Medicine.    Plan:    Treatment:      Endometrial mass          Notes: Given the size of the mass and vasularity d/w pt R/B/A of hystereoscopy D&C possible polypectomy to rule out hyperplasia or malignancy. . . rb/a of surgery discussed with the patient including but not limited to infection bleeding perforation  of the uterus with the need for further surgery. pt voiced understanding and desires to proceed. she is advised not to eat or drink anything after midnight the night prior to surgery.      C. difficile colitis          Notes:  she has completed course of vancomycin . she denies any loos stool for over 1 1/2 week   .

## 2018-04-04 NOTE — Anesthesia Preprocedure Evaluation (Addendum)
Anesthesia Evaluation  Patient identified by MRN, date of birth, ID band Patient awake    Reviewed: Allergy & Precautions, H&P , NPO status , Patient's Chart, lab work & pertinent test results, reviewed documented beta blocker date and time   Airway Mallampati: II  TM Distance: <3 FB Neck ROM: full   Comment: Looks anterior  Dental no notable dental hx. (+) Edentulous Upper, Upper Dentures   Pulmonary neg pulmonary ROS,    Pulmonary exam normal breath sounds clear to auscultation       Cardiovascular Exercise Tolerance: Good hypertension, Pt. on medications negative cardio ROS   Rhythm:regular Rate:Normal     Neuro/Psych negative neurological ROS  negative psych ROS   GI/Hepatic negative GI ROS, Neg liver ROS,   Endo/Other  negative endocrine ROS  Renal/GU negative Renal ROS  negative genitourinary   Musculoskeletal   Abdominal   Peds  Hematology negative hematology ROS (+)   Anesthesia Other Findings   Reproductive/Obstetrics negative OB ROS                            Anesthesia Physical Anesthesia Plan  ASA: II  Anesthesia Plan: General   Post-op Pain Management:    Induction: Intravenous  PONV Risk Score and Plan: 3 and Ondansetron and Treatment may vary due to age or medical condition  Airway Management Planned: LMA  Additional Equipment:   Intra-op Plan:   Post-operative Plan: Extubation in OR  Informed Consent: I have reviewed the patients History and Physical, chart, labs and discussed the procedure including the risks, benefits and alternatives for the proposed anesthesia with the patient or authorized representative who has indicated his/her understanding and acceptance.   Dental Advisory Given  Plan Discussed with: CRNA, Anesthesiologist and Surgeon  Anesthesia Plan Comments: ( )        Anesthesia Quick Evaluation

## 2018-04-04 NOTE — Discharge Instructions (Signed)
DISCHARGE INSTRUCTIONS: D&C / D&E The following instructions have been prepared to help you care for yourself upon your return home.   Personal hygiene:  Use sanitary pads for vaginal drainage, not tampons.  Shower the day after your procedure.  NO tub baths, pools or Jacuzzis for 2-3 weeks.  Wipe front to back after using the bathroom.  Activity and limitations:  Do NOT drive or operate any equipment for 24 hours. The effects of anesthesia are still present and drowsiness may result.  Do NOT rest in bed all day.  Walking is encouraged.  Walk up and down stairs slowly.  You may resume your normal activity in one to two days or as indicated by your physician.  Sexual activity: NO intercourse for at least 2 weeks after the procedure, or as indicated by your physician.  Diet: Eat a light meal as desired this evening. You may resume your usual diet tomorrow.  Return to work: You may resume your work activities in one to two days or as indicated by your doctor.  What to expect after your surgery: Expect to have vaginal bleeding/discharge for 2-3 days and spotting for up to 10 days. It is not unusual to have soreness for up to 1-2 weeks. You may have a slight burning sensation when you urinate for the first day. Mild cramps may continue for a couple of days. You may have a regular period in 2-6 weeks.  Call your doctor for any of the following:  Excessive vaginal bleeding, saturating and changing one pad every hour.  Inability to urinate 6 hours after discharge from hospital.  Pain not relieved by pain medication.  Fever of 100.4 F or greater.  Unusual vaginal discharge or odor.    Post Anesthesia Home Care Instructions  Activity: Get plenty of rest for the remainder of the day. A responsible individual must stay with you for 24 hours following the procedure.  For the next 24 hours, DO NOT: -Drive a car -Operate machinery -Drink alcoholic beverages -Take any medication unless  instructed by your physician -Make any legal decisions or sign important papers.  Meals: Start with liquid foods such as gelatin or soup. Progress to regular foods as tolerated. Avoid greasy, spicy, heavy foods. If nausea and/or vomiting occur, drink only clear liquids until the nausea and/or vomiting subsides. Call your physician if vomiting continues.  Special Instructions/Symptoms: Your throat may feel dry or sore from the anesthesia or the breathing tube placed in your throat during surgery. If this causes discomfort, gargle with warm salt water. The discomfort should disappear within 24 hours.  

## 2018-04-04 NOTE — Transfer of Care (Signed)
Immediate Anesthesia Transfer of Care Note  Patient: Miranda Ramirez  Procedure(s) Performed: DILATATION & CURETTAGE/HYSTEROSCOPY WITH MYOSURE (N/A Vagina )  Patient Location: PACU  Anesthesia Type:General  Level of Consciousness: sedated  Airway & Oxygen Therapy: Patient Spontanous Breathing and Patient connected to nasal cannula oxygen  Post-op Assessment: Report given to RN  Post vital signs: Reviewed and stable  Last Vitals:  Vitals Value Taken Time  BP    Temp    Pulse    Resp    SpO2      Last Pain:  Vitals:   04/04/18 1416  TempSrc: Oral  PainSc: 0-No pain      Patients Stated Pain Goal: 3 (69/67/89 3810)  Complications: No apparent anesthesia complications

## 2018-04-04 NOTE — Anesthesia Procedure Notes (Signed)
Procedure Name: LMA Insertion Date/Time: 04/04/2018 3:38 PM Performed by: Asher Muir, CRNA Pre-anesthesia Checklist: Patient being monitored, Patient identified, Emergency Drugs available and Suction available Patient Re-evaluated:Patient Re-evaluated prior to induction Oxygen Delivery Method: Circle system utilized Preoxygenation: Pre-oxygenation with 100% oxygen Induction Type: IV induction and Inhalational induction Ventilation: Mask ventilation without difficulty LMA: LMA inserted LMA Size: 4.0 Number of attempts: 1 Dental Injury: Teeth and Oropharynx as per pre-operative assessment

## 2018-04-04 NOTE — Anesthesia Postprocedure Evaluation (Signed)
Anesthesia Post Note  Patient: Miranda Ramirez  Procedure(s) Performed: DILATATION & CURETTAGE/HYSTEROSCOPY WITH MYOSURE (N/A Vagina )     Patient location during evaluation: PACU Anesthesia Type: General Level of consciousness: awake and alert Pain management: pain level controlled Vital Signs Assessment: post-procedure vital signs reviewed and stable Respiratory status: spontaneous breathing, nonlabored ventilation, respiratory function stable and patient connected to nasal cannula oxygen Cardiovascular status: blood pressure returned to baseline and stable Postop Assessment: no apparent nausea or vomiting Anesthetic complications: no    Last Vitals:  Vitals:   04/04/18 1645 04/04/18 1700  BP: (!) 143/67 134/68  Pulse: 73 71  Resp: 13 16  Temp:    SpO2: 98% 98%    Last Pain:  Vitals:   04/04/18 1700  TempSrc:   PainSc: 0-No pain   Pain Goal: Patients Stated Pain Goal: 3 (04/04/18 1416)               Etana Beets

## 2018-04-04 NOTE — Op Note (Signed)
04/04/2018  4:21 PM  PATIENT:  Miranda Ramirez  72 y.o. female  PRE-OPERATIVE DIAGNOSIS:  N94.89 Endometrial mass  POST-OPERATIVE DIAGNOSIS:  endometrial mass  PROCEDURE:  Procedure(s): DILATATION & CURETTAGE/HYSTEROSCOPY WITH MYOSURE (N/A)  SURGEON:  Surgeon(s) and Role:    Christophe Louis, MD - Primary  PHYSICIAN ASSISTANT:   ASSISTANTS: none   ANESTHESIA:   general  EBL:  10 mL   BLOOD ADMINISTERED:none  DRAINS: none   LOCAL MEDICATIONS USED:  MARCAINE     SPECIMEN:  Source of Specimen:  endometrial curretting possible endometrial polyp  DISPOSITION OF SPECIMEN:  PATHOLOGY  COUNTS:  YES  TOURNIQUET:  * No tourniquets in log *  DICTATION: .Dragon Dictation  PLAN OF CARE: Discharge to home after PACU  PATIENT DISPOSITION:  PACU - hemodynamically stable.   Delay start of Pharmacological VTE agent (>24hrs) due to surgical blood loss or risk of bleeding: not applicable  Finding: stenotic cervical os/ atrophic vaginal mucosa.. Proliferative endometrium with calcified appearance. Questionable endometrial polyp  Procedure: Patient was taken to the operating room where she was placed under general anesthesia. She was placed in the dorsal lithotomy position. She was prepped and draped in the usual sterile fashion. A speculum was placed into the vaginal vault. The anterior lip of the cervix was grasped with a single-tooth tenaculum. Quarter percent Marcaine was injected at the 4 and 8:00 positions of the cervix. The cervical os was noted to be stenotic. A #1 lacrimal duct probe was used to open the cervix .Marland Kitchen The cervical os finder was then used to dilate the cerivcal os further. The cervix was then sounded to 5 cm. The cervix was dilated to approximately 6 mm. Mysosure operative  hysteroscope was inserted. The findings noted above. Myosure lite  blade was introduced throught the hysteroscope. The endometrial mass was removed in  Less than 5 minute.  There was no evidence of  perforation. Hysteroscope was then removed. Sharp curette was inserted and endometrial curettings were obtained. The hysteroscope was reinserted. There was no sign of uterine  perforation. .The hysteroscope was removed.  The single-tooth tenaculum was removed from the anterior lip of the cervix. The speculum was removed from the patient's vagina. She was awakened from anesthesia and  taken care  to the recovery  room awake and in stable condition. Sponge lap and needle counts were correct x2.

## 2018-04-05 ENCOUNTER — Encounter (HOSPITAL_COMMUNITY): Payer: Self-pay | Admitting: Obstetrics and Gynecology

## 2018-04-18 DIAGNOSIS — N840 Polyp of corpus uteri: Secondary | ICD-10-CM | POA: Diagnosis not present

## 2018-04-24 DIAGNOSIS — I10 Essential (primary) hypertension: Secondary | ICD-10-CM | POA: Diagnosis not present

## 2018-04-24 DIAGNOSIS — H5203 Hypermetropia, bilateral: Secondary | ICD-10-CM | POA: Diagnosis not present

## 2018-04-24 DIAGNOSIS — H25099 Other age-related incipient cataract, unspecified eye: Secondary | ICD-10-CM | POA: Diagnosis not present

## 2018-04-24 DIAGNOSIS — H524 Presbyopia: Secondary | ICD-10-CM | POA: Diagnosis not present

## 2018-04-24 DIAGNOSIS — H52223 Regular astigmatism, bilateral: Secondary | ICD-10-CM | POA: Diagnosis not present

## 2018-10-01 ENCOUNTER — Other Ambulatory Visit: Payer: Self-pay | Admitting: Family Medicine

## 2018-10-01 DIAGNOSIS — Z1231 Encounter for screening mammogram for malignant neoplasm of breast: Secondary | ICD-10-CM

## 2018-12-05 ENCOUNTER — Ambulatory Visit
Admission: RE | Admit: 2018-12-05 | Discharge: 2018-12-05 | Disposition: A | Discharge status: Home / Self Care | Payer: PPO | Source: Ambulatory Visit | Attending: Family Medicine | Admitting: Family Medicine

## 2018-12-05 ENCOUNTER — Other Ambulatory Visit: Payer: Self-pay

## 2018-12-05 DIAGNOSIS — Z1231 Encounter for screening mammogram for malignant neoplasm of breast: Secondary | ICD-10-CM

## 2019-03-29 DIAGNOSIS — Z0000 Encounter for general adult medical examination without abnormal findings: Secondary | ICD-10-CM | POA: Diagnosis not present

## 2019-03-29 DIAGNOSIS — M810 Age-related osteoporosis without current pathological fracture: Secondary | ICD-10-CM | POA: Diagnosis not present

## 2019-03-29 DIAGNOSIS — I700 Atherosclerosis of aorta: Secondary | ICD-10-CM | POA: Diagnosis not present

## 2019-03-29 DIAGNOSIS — E7800 Pure hypercholesterolemia, unspecified: Secondary | ICD-10-CM | POA: Diagnosis not present

## 2019-03-29 DIAGNOSIS — I10 Essential (primary) hypertension: Secondary | ICD-10-CM | POA: Diagnosis not present

## 2019-03-29 DIAGNOSIS — Z79899 Other long term (current) drug therapy: Secondary | ICD-10-CM | POA: Diagnosis not present

## 2019-04-22 DIAGNOSIS — Z01419 Encounter for gynecological examination (general) (routine) without abnormal findings: Secondary | ICD-10-CM | POA: Diagnosis not present

## 2019-05-08 DIAGNOSIS — I10 Essential (primary) hypertension: Secondary | ICD-10-CM | POA: Diagnosis not present

## 2019-05-08 DIAGNOSIS — H5203 Hypermetropia, bilateral: Secondary | ICD-10-CM | POA: Diagnosis not present

## 2019-05-08 DIAGNOSIS — H25099 Other age-related incipient cataract, unspecified eye: Secondary | ICD-10-CM | POA: Diagnosis not present

## 2019-05-08 DIAGNOSIS — H52223 Regular astigmatism, bilateral: Secondary | ICD-10-CM | POA: Diagnosis not present

## 2019-05-08 DIAGNOSIS — H524 Presbyopia: Secondary | ICD-10-CM | POA: Diagnosis not present

## 2019-10-25 ENCOUNTER — Other Ambulatory Visit: Payer: Self-pay | Admitting: Family Medicine

## 2019-10-25 DIAGNOSIS — Z1231 Encounter for screening mammogram for malignant neoplasm of breast: Secondary | ICD-10-CM

## 2019-11-04 ENCOUNTER — Encounter: Payer: Self-pay | Admitting: Internal Medicine

## 2019-11-04 ENCOUNTER — Other Ambulatory Visit: Payer: Self-pay

## 2019-11-04 ENCOUNTER — Ambulatory Visit (AMBULATORY_SURGERY_CENTER): Payer: Self-pay

## 2019-11-04 VITALS — Temp 98.8°F | Ht 63.0 in | Wt 172.0 lb

## 2019-11-04 DIAGNOSIS — Z86010 Personal history of colonic polyps: Secondary | ICD-10-CM

## 2019-11-04 MED ORDER — SUTAB 1479-225-188 MG PO TABS: 12.0000 | ORAL_TABLET | ORAL | 0 refills | Status: DC

## 2019-11-04 NOTE — Progress Notes (Signed)
No egg or soy allergy known to patient  No issues with past sedation with any surgeries  or procedures, no intubation problems  No diet pills per patient No home 02 use per patient  No blood thinners per patient  Pt denies issues with constipation  No A fib or A flutter  EMMI video sent to pt's e mail   Pt completed covid vaccine series 08/02/19  Due to the COVID-19 pandemic we are asking patients to follow these guidelines. Please only bring one care partner. Please be aware that your care partner may wait in the car in the parking lot or if they feel like they will be too hot to wait in the car, they may wait in the lobby on the 4th floor. All care partners are required to wear a mask the entire time (we do not have any that we can provide them), they need to practice social distancing, and we will do a Covid check for all patient's and care partners when you arrive. Also we will check their temperature and your temperature. If the care partner waits in their car they need to stay in the parking lot the entire time and we will call them on their cell phone when the patient is ready for discharge so they can bring the car to the front of the building. Also all patient's will need to wear a mask into building.

## 2019-11-15 ENCOUNTER — Telehealth: Payer: Self-pay | Admitting: Internal Medicine

## 2019-11-15 NOTE — Telephone Encounter (Signed)
Encouraged patient to drink water with her prep.

## 2019-11-15 NOTE — Telephone Encounter (Signed)
Patient called would like to know if she can drink the prep medication with tea or just water?

## 2019-11-26 ENCOUNTER — Ambulatory Visit (AMBULATORY_SURGERY_CENTER): Payer: PPO | Admitting: Internal Medicine

## 2019-11-26 ENCOUNTER — Other Ambulatory Visit: Payer: Self-pay

## 2019-11-26 ENCOUNTER — Encounter: Payer: Self-pay | Admitting: Internal Medicine

## 2019-11-26 VITALS — BP 100/51 | HR 72 | Temp 97.5°F | Resp 13 | Ht 63.0 in | Wt 172.0 lb

## 2019-11-26 DIAGNOSIS — Z860100 Personal history of colon polyps, unspecified: Secondary | ICD-10-CM

## 2019-11-26 DIAGNOSIS — Z1211 Encounter for screening for malignant neoplasm of colon: Secondary | ICD-10-CM | POA: Diagnosis not present

## 2019-11-26 DIAGNOSIS — D120 Benign neoplasm of cecum: Secondary | ICD-10-CM | POA: Diagnosis not present

## 2019-11-26 MED ORDER — SODIUM CHLORIDE 0.9 % IV SOLN: 500.0000 mL | INTRAVENOUS | Status: DC

## 2019-11-26 NOTE — Op Note (Signed)
[place] Patient Name: Miranda Ramirez Procedure Date: 11/26/2019 8:08 AM MRN: [id number] Endoscopist: Docia Chuck. Henrene Pastor , MD Age: 74 Referring MD:  Date of Birth: 04-Nov-1945 Gender: Female Account #: [id number] Procedure:                Colonoscopy with cold snare polypectomy x 1 Indications:              High risk colon cancer surveillance: Personal                            history of multiple (3 or more) adenomas. Previous                            examination April 2018 Medicines:                Monitored Anesthesia Care Procedure:                Pre-Anesthesia Assessment:                           - Prior to the procedure, a History and Physical                            was performed, and patient medications and                            allergies were reviewed. The patient's tolerance of                            previous anesthesia was also reviewed. The risks                            and benefits of the procedure and the sedation                            options and risks were discussed with the patient.                            All questions were answered, and informed consent                            was obtained. Prior Anticoagulants: The patient has                            taken no previous anticoagulant or antiplatelet                            agents. ASA Grade Assessment: II - A patient with                            mild systemic disease. After reviewing the risks                            and benefits, the patient was deemed in  satisfactory condition to undergo the procedure.                           After obtaining informed consent, the colonoscope                            was passed under direct vision. Throughout the                            procedure, the patient's blood pressure, pulse, and                            oxygen saturations were monitored continuously. The   Colonoscope was introduced through the anus and                            advanced to the the cecum, identified by                            appendiceal orifice and ileocecal valve. The                            ileocecal valve, appendiceal orifice, and rectum                            were photographed. The quality of the bowel                            preparation was excellent. The colonoscopy was                            performed without difficulty. The patient tolerated                            the procedure well. The bowel preparation used was                            SUPREP via split dose instruction. Scope In: 8:21:38 AM Scope Out: 8:36:43 AM Scope Withdrawal Time: 0 hours 11 minutes 56 seconds  Total Procedure Duration: 0 hours 15 minutes 5 seconds  Findings:                 A 3 mm polyp was found in the cecum. The polyp was                            removed with a cold snare. Resection and retrieval                            were complete.                           Multiple small-mouthed diverticula were found in                            the sigmoid colon.  Internal hemorrhoids were found during                            retroflexion. The hemorrhoids were large.                           The exam was otherwise without abnormality on                            direct and retroflexion views. Complications:            No immediate complications. Estimated blood loss:                            None. Estimated Blood Loss:     Estimated blood loss: none. Impression:               - One 3 mm polyp in the cecum, removed with a cold                            snare. Resected and retrieved.                           - Diverticulosis in the sigmoid colon.                           - Internal hemorrhoids.                           - The examination was otherwise normal on direct                            and retroflexion views. Recommendation:            - Repeat colonoscopy is not recommended for                            surveillance (current age and favorable findings).                           - Patient has a contact number available for                            emergencies. The signs and symptoms of potential                            delayed complications were discussed with the                            patient. Return to normal activities tomorrow.                            Written discharge instructions were provided to the                            patient.                           -  Resume previous diet.                           - Continue present medications.                           - Await pathology results. Docia Chuck. Henrene Pastor, MD 11/26/2019 8:55:50 AM This report has been signed electronically.

## 2019-11-26 NOTE — Progress Notes (Signed)
Called to room to assist during endoscopic procedure.  Patient ID and intended procedure confirmed with present staff. Received instructions for my participation in the procedure from the performing physician.  

## 2019-11-26 NOTE — Patient Instructions (Signed)
YOU HAD AN ENDOSCOPIC PROCEDURE TODAY AT THE [facility]:   Refer to the procedure report that was given to you for any specific questions about what was found during the examination.  If the procedure report does not answer your questions, please call your gastroenterologist to clarify.  If you requested that your care partner not be given the details of your procedure findings, then the procedure report has been included in a sealed envelope for you to review at your convenience later.  YOU SHOULD EXPECT: Some feelings of bloating in the abdomen. Passage of more gas than usual.  Walking can help get rid of the air that was put into your GI tract during the procedure and reduce the bloating. If you had a lower endoscopy (such as a colonoscopy or flexible sigmoidoscopy) you may notice spotting of blood in your stool or on the toilet paper. If you underwent a bowel prep for your procedure, you may not have a normal bowel movement for a few days.  Please Note:  You might notice some irritation and congestion in your nose or some drainage.  This is from the oxygen used during your procedure.  There is no need for concern and it should clear up in a day or so.  SYMPTOMS TO REPORT IMMEDIATELY:   Following lower endoscopy (colonoscopy or flexible sigmoidoscopy):  Excessive amounts of blood in the stool  Significant tenderness or worsening of abdominal pains  Swelling of the abdomen that is new, acute  Fever of 100F or higher   For urgent or emergent issues, a gastroenterologist can be reached at any hour by calling [phone]. Do not use MyChart messaging for urgent concerns.    DIET:  We do recommend a small meal at first, but then you may proceed to your regular diet.  Drink plenty of fluids but you should avoid alcoholic beverages for 24 hours.  MEDICATIONS: Continue present medications.  Please see handouts given to you by your recovery nurse.  ACTIVITY:  You should plan to  take it easy for the rest of today and you should NOT DRIVE or use heavy machinery until tomorrow (because of the sedation medicines used during the test).    FOLLOW UP: Our staff will call the number listed on your records 48-72 hours following your procedure to check on you and address any questions or concerns that you may have regarding the information given to you following your procedure. If we do not reach you, we will leave a message.  We will attempt to reach you two times.  During this call, we will ask if you have developed any symptoms of COVID 19. If you develop any symptoms (ie: fever, flu-like symptoms, shortness of breath, cough etc.) before then, please call [phone].  If you test positive for Covid 19 in the 2 weeks post procedure, please call and report this information to us.    If any biopsies were taken you will be contacted by phone or by letter within the next 1-3 weeks.  Please call us at [phone] if you have not heard about the biopsies in 3 weeks.   Thank you for allowing us to provide for your healthcare needs today.   SIGNATURES/CONFIDENTIALITY: You and/or your care partner have signed paperwork which will be entered into your electronic medical record.  These signatures attest to the fact that that the information above on your After Visit Summary has been reviewed and is understood.  Full responsibility of the   confidentiality of this discharge information lies with you and/or your care-partner. 

## 2019-11-26 NOTE — Progress Notes (Signed)
V/s DT I have reviewed the patient's medical history in detail and updated the computerized patient record. 

## 2019-11-26 NOTE — Progress Notes (Signed)
A/ox3, pleased with MAC, report to RN 

## 2019-11-28 ENCOUNTER — Encounter: Payer: Self-pay | Admitting: Internal Medicine

## 2019-11-28 ENCOUNTER — Telehealth: Payer: Self-pay

## 2019-11-28 NOTE — Telephone Encounter (Signed)
  Follow up Call-  Call back number 11/26/2019  Post procedure Call Back phone  # [phone]  Permission to leave phone message Yes  Some recent data might be hidden     Patient questions:  Do you have a fever, pain , or abdominal swelling? No. Pain Score  0 *  Have you tolerated food without any problems? Yes.    Have you been able to return to your normal activities? Yes.    Do you have any questions about your discharge instructions: Diet   No. Medications  No. Follow up visit  No.  Do you have questions or concerns about your Care? No.  Actions: * If pain score is 4 or above: No action needed, pain <4.  1. Have you developed a fever since your procedure? no  2.   Have you had an respiratory symptoms (SOB or cough) since your procedure? no  3.   Have you tested positive for COVID 19 since your procedure no  4.   Have you had any family members/close contacts diagnosed with the COVID 19 since your procedure?  no   If yes to any of these questions please route to Joylene John, RN and Erenest Rasher, RN

## 2019-12-06 ENCOUNTER — Other Ambulatory Visit: Payer: Self-pay

## 2019-12-06 ENCOUNTER — Ambulatory Visit
Admission: RE | Admit: 2019-12-06 | Discharge: 2019-12-06 | Disposition: A | Discharge status: Home / Self Care | Payer: PPO | Source: Ambulatory Visit | Attending: Family Medicine | Admitting: Family Medicine

## 2019-12-06 DIAGNOSIS — Z1231 Encounter for screening mammogram for malignant neoplasm of breast: Secondary | ICD-10-CM | POA: Diagnosis not present

## 2020-04-07 DIAGNOSIS — M810 Age-related osteoporosis without current pathological fracture: Secondary | ICD-10-CM | POA: Diagnosis not present

## 2020-04-07 DIAGNOSIS — I10 Essential (primary) hypertension: Secondary | ICD-10-CM | POA: Diagnosis not present

## 2020-04-07 DIAGNOSIS — E7800 Pure hypercholesterolemia, unspecified: Secondary | ICD-10-CM | POA: Diagnosis not present

## 2020-04-07 DIAGNOSIS — E2839 Other primary ovarian failure: Secondary | ICD-10-CM | POA: Diagnosis not present

## 2020-04-07 DIAGNOSIS — E559 Vitamin D deficiency, unspecified: Secondary | ICD-10-CM | POA: Diagnosis not present

## 2020-04-07 DIAGNOSIS — Z79899 Other long term (current) drug therapy: Secondary | ICD-10-CM | POA: Diagnosis not present

## 2020-04-07 DIAGNOSIS — Z0000 Encounter for general adult medical examination without abnormal findings: Secondary | ICD-10-CM | POA: Diagnosis not present

## 2020-04-07 DIAGNOSIS — Z8619 Personal history of other infectious and parasitic diseases: Secondary | ICD-10-CM | POA: Diagnosis not present

## 2020-04-07 DIAGNOSIS — I700 Atherosclerosis of aorta: Secondary | ICD-10-CM | POA: Diagnosis not present

## 2020-04-16 ENCOUNTER — Other Ambulatory Visit: Payer: Self-pay | Admitting: Family Medicine

## 2020-04-16 DIAGNOSIS — E2839 Other primary ovarian failure: Secondary | ICD-10-CM

## 2020-07-14 ENCOUNTER — Other Ambulatory Visit: Payer: PPO

## 2020-09-23 ENCOUNTER — Other Ambulatory Visit: Payer: PPO

## 2020-10-10 ENCOUNTER — Other Ambulatory Visit: Payer: PPO

## 2020-10-22 ENCOUNTER — Other Ambulatory Visit: Payer: Self-pay | Admitting: Family Medicine

## 2020-10-22 DIAGNOSIS — Z1231 Encounter for screening mammogram for malignant neoplasm of breast: Secondary | ICD-10-CM

## 2020-12-16 ENCOUNTER — Other Ambulatory Visit: Payer: Self-pay

## 2020-12-16 ENCOUNTER — Ambulatory Visit
Admission: RE | Admit: 2020-12-16 | Discharge: 2020-12-16 | Disposition: A | Discharge status: Home / Self Care | Payer: PPO | Source: Ambulatory Visit | Attending: Family Medicine | Admitting: Family Medicine

## 2020-12-16 DIAGNOSIS — Z1231 Encounter for screening mammogram for malignant neoplasm of breast: Secondary | ICD-10-CM | POA: Diagnosis not present

## 2021-04-06 ENCOUNTER — Other Ambulatory Visit: Payer: Self-pay

## 2021-04-06 ENCOUNTER — Ambulatory Visit
Admission: RE | Admit: 2021-04-06 | Discharge: 2021-04-06 | Disposition: A | Discharge status: Home / Self Care | Payer: PPO | Source: Ambulatory Visit | Attending: Family Medicine | Admitting: Family Medicine

## 2021-04-06 DIAGNOSIS — E2839 Other primary ovarian failure: Secondary | ICD-10-CM

## 2021-04-06 DIAGNOSIS — Z79899 Other long term (current) drug therapy: Secondary | ICD-10-CM | POA: Diagnosis not present

## 2021-04-06 DIAGNOSIS — E7800 Pure hypercholesterolemia, unspecified: Secondary | ICD-10-CM | POA: Diagnosis not present

## 2021-04-06 DIAGNOSIS — M8589 Other specified disorders of bone density and structure, multiple sites: Secondary | ICD-10-CM | POA: Diagnosis not present

## 2021-04-06 DIAGNOSIS — M810 Age-related osteoporosis without current pathological fracture: Secondary | ICD-10-CM | POA: Diagnosis not present

## 2021-04-06 DIAGNOSIS — R7301 Impaired fasting glucose: Secondary | ICD-10-CM | POA: Diagnosis not present

## 2021-04-06 DIAGNOSIS — Z780 Asymptomatic menopausal state: Secondary | ICD-10-CM | POA: Diagnosis not present

## 2021-04-09 DIAGNOSIS — R7301 Impaired fasting glucose: Secondary | ICD-10-CM | POA: Diagnosis not present

## 2021-04-09 DIAGNOSIS — E7800 Pure hypercholesterolemia, unspecified: Secondary | ICD-10-CM | POA: Diagnosis not present

## 2021-04-09 DIAGNOSIS — I10 Essential (primary) hypertension: Secondary | ICD-10-CM | POA: Diagnosis not present

## 2021-04-09 DIAGNOSIS — I700 Atherosclerosis of aorta: Secondary | ICD-10-CM | POA: Diagnosis not present

## 2021-04-09 DIAGNOSIS — M810 Age-related osteoporosis without current pathological fracture: Secondary | ICD-10-CM | POA: Diagnosis not present

## 2021-04-09 DIAGNOSIS — Z79899 Other long term (current) drug therapy: Secondary | ICD-10-CM | POA: Diagnosis not present

## 2021-04-09 DIAGNOSIS — Z0000 Encounter for general adult medical examination without abnormal findings: Secondary | ICD-10-CM | POA: Diagnosis not present

## 2021-07-20 IMAGING — MG MM DIGITAL SCREENING BILAT W/ TOMO AND CAD
8 series · 9 of 24 positions shown · non-contrast
Comparison: Previous exam(s).

CLINICAL DATA: Screening.

EXAM:
DIGITAL SCREENING BILATERAL MAMMOGRAM WITH TOMOSYNTHESIS AND CAD
TECHNIQUE: Bilateral screening digital craniocaudal and mediolateral oblique
mammograms were obtained. Bilateral screening digital breast
tomosynthesis was performed. The images were evaluated with
computer-aided detection.

[L MLO synth-2D]
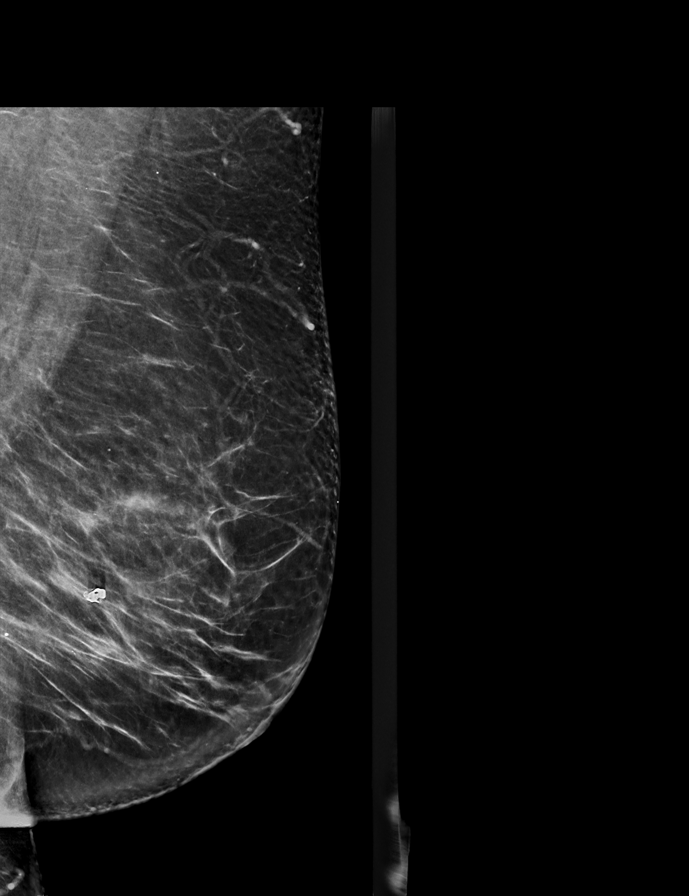

[R CC synth-2D]
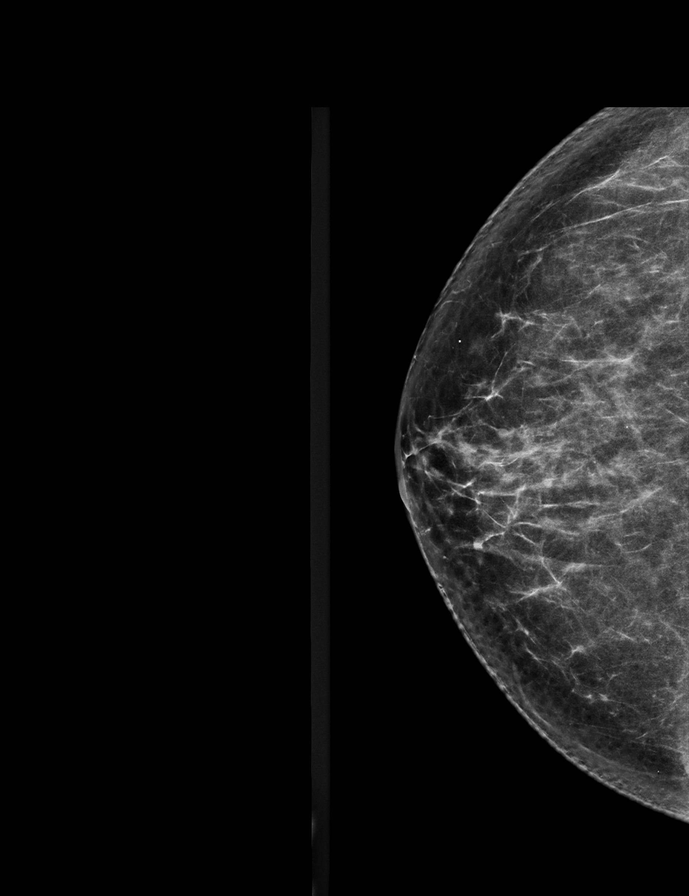

[R MLO synth-2D]
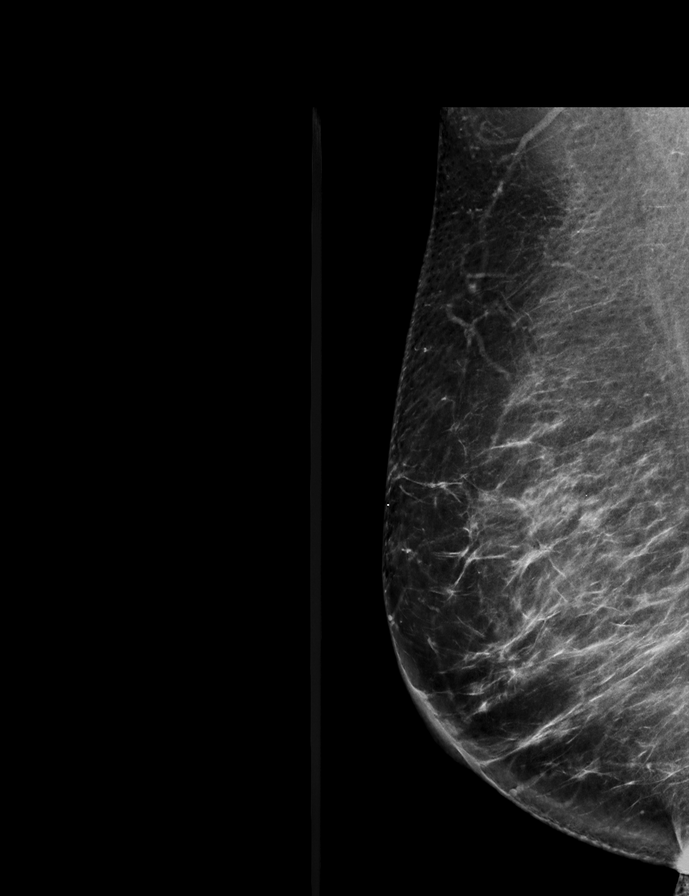

[L CC synth-2D]
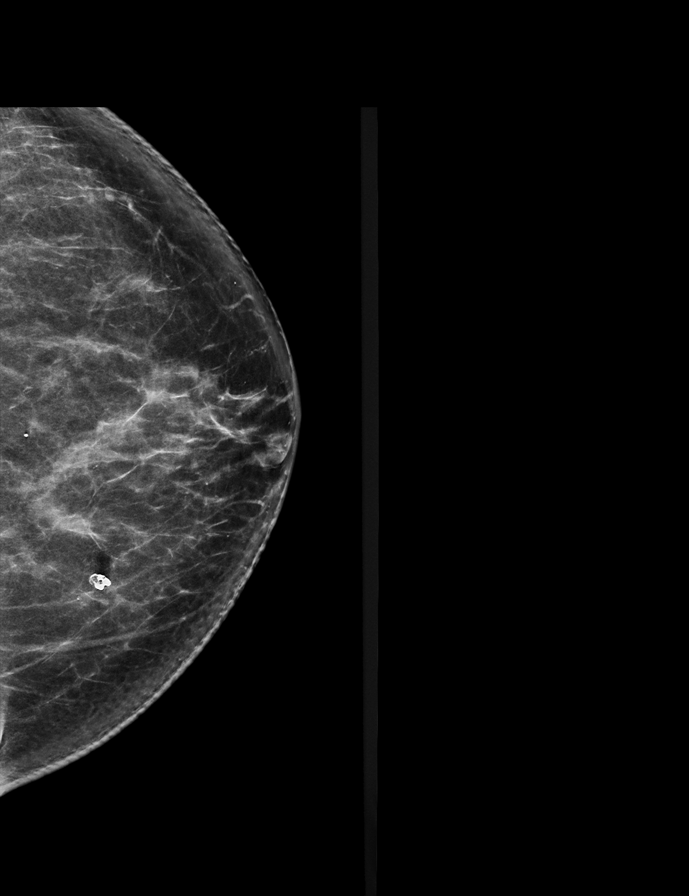

[L CC tomo · 2 of 73 frames shown]
[frame 24/73]
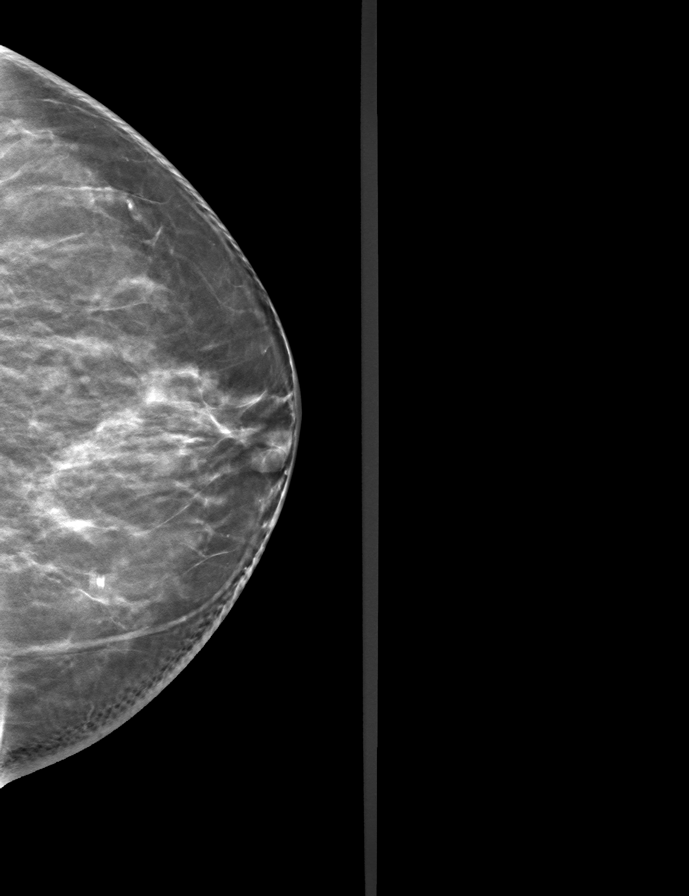
[frame 37/73]
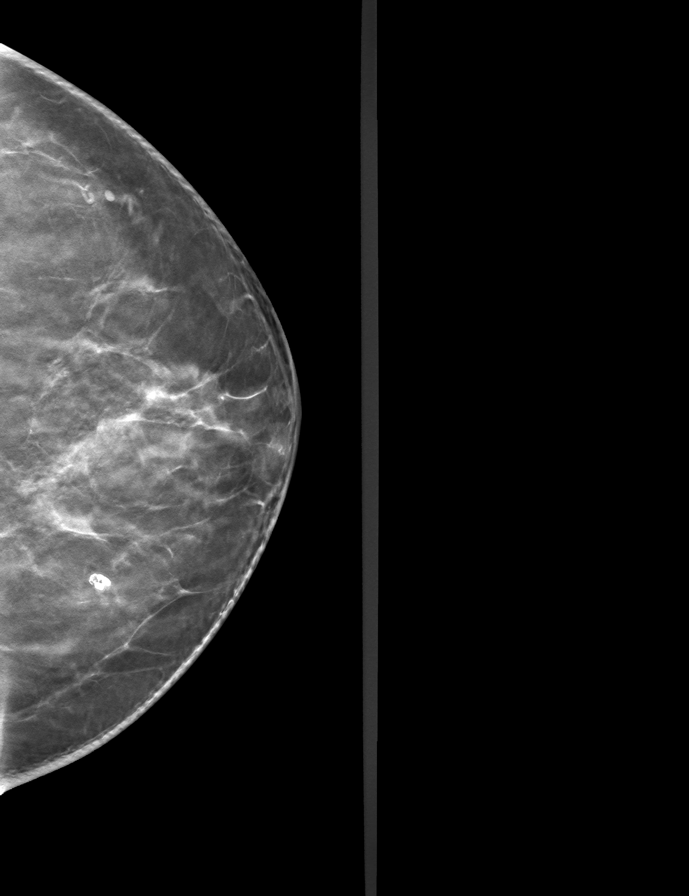

[R CC tomo · tomo slice 35/70.0]
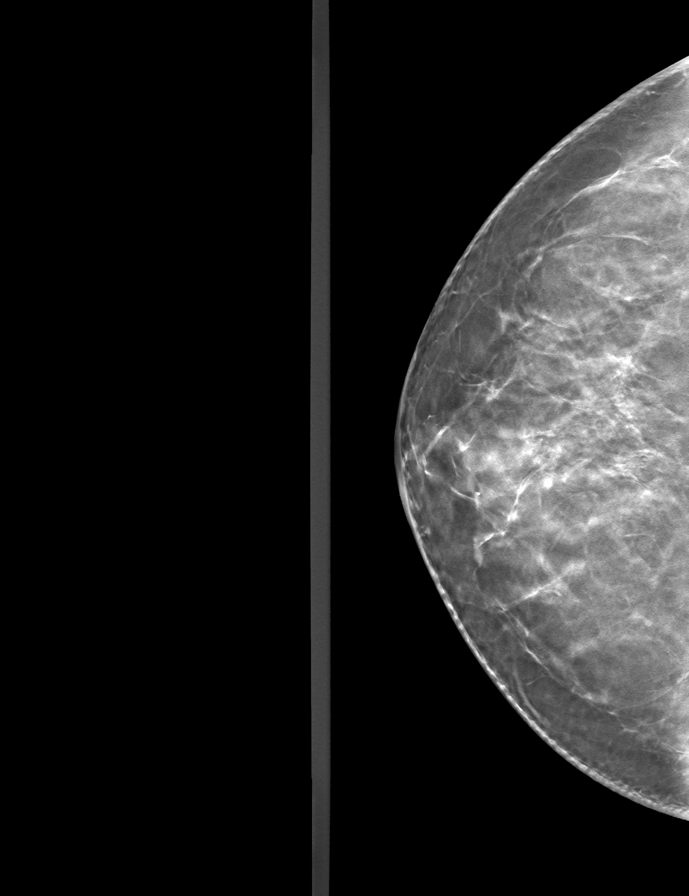

[R MLO tomo · tomo slice 40/79.0]
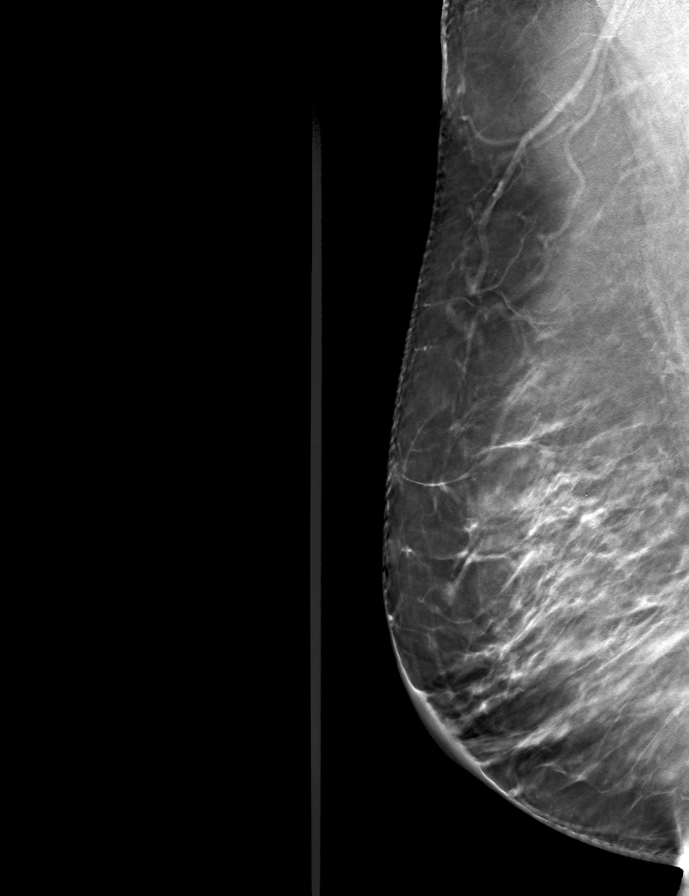

[L MLO tomo · tomo slice 43/85.0]
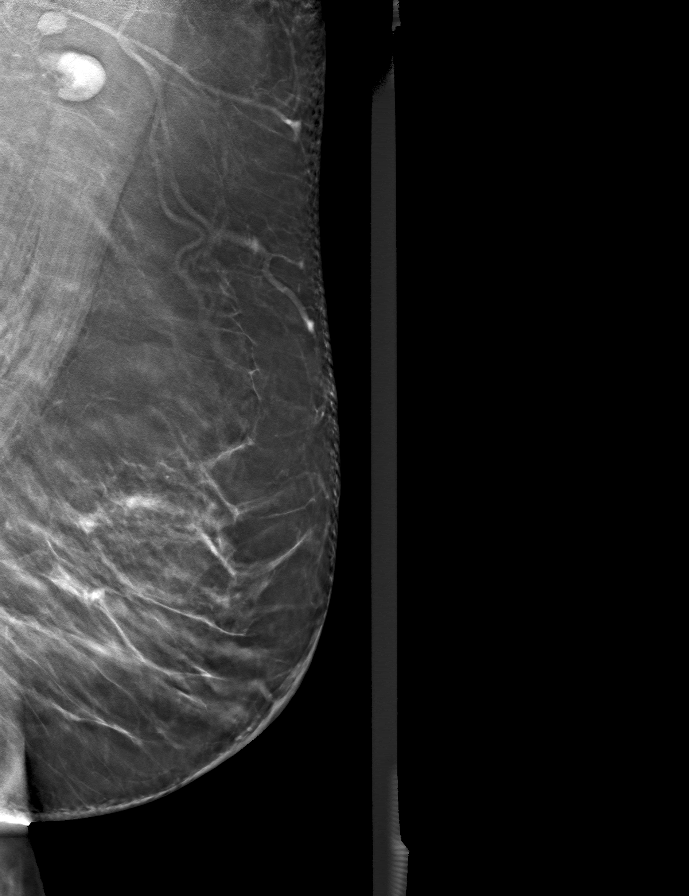

[9 of 24 positions shown; findings below may reference images not displayed]

ACR Breast Density Category b: There are scattered areas of
fibroglandular density.
FINDINGS: There are no findings suspicious for malignancy.
IMPRESSION: No mammographic evidence of malignancy. A result letter of this
screening mammogram will be mailed directly to the patient.

RECOMMENDATION:
Screening mammogram in one year. (Code:51-O-LD2)

BI-RADS CATEGORY  1: Negative.

## 2021-10-15 DIAGNOSIS — L821 Other seborrheic keratosis: Secondary | ICD-10-CM | POA: Diagnosis not present

## 2021-10-15 DIAGNOSIS — D225 Melanocytic nevi of trunk: Secondary | ICD-10-CM | POA: Diagnosis not present

## 2021-11-02 ENCOUNTER — Other Ambulatory Visit: Payer: Self-pay | Admitting: Family Medicine

## 2021-11-02 DIAGNOSIS — Z1231 Encounter for screening mammogram for malignant neoplasm of breast: Secondary | ICD-10-CM

## 2021-11-09 DIAGNOSIS — Z01419 Encounter for gynecological examination (general) (routine) without abnormal findings: Secondary | ICD-10-CM | POA: Diagnosis not present

## 2021-11-09 DIAGNOSIS — K644 Residual hemorrhoidal skin tags: Secondary | ICD-10-CM | POA: Diagnosis not present

## 2021-12-17 ENCOUNTER — Ambulatory Visit
Admission: RE | Admit: 2021-12-17 | Discharge: 2021-12-17 | Disposition: A | Discharge status: Home / Self Care | Payer: PPO | Source: Ambulatory Visit | Attending: Family Medicine | Admitting: Family Medicine

## 2021-12-17 DIAGNOSIS — Z1231 Encounter for screening mammogram for malignant neoplasm of breast: Secondary | ICD-10-CM

## 2022-03-27 ENCOUNTER — Ambulatory Visit
Admission: EM | Admit: 2022-03-27 | Discharge: 2022-03-27 | Disposition: A | Discharge status: Home / Self Care | Payer: PPO | Attending: Emergency Medicine | Admitting: Emergency Medicine

## 2022-03-27 DIAGNOSIS — N3001 Acute cystitis with hematuria: Secondary | ICD-10-CM | POA: Diagnosis not present

## 2022-03-27 LAB — POCT URINALYSIS DIP (MANUAL ENTRY)
Bilirubin, UA: NEGATIVE
Glucose, UA: NEGATIVE mg/dL
Ketones, POC UA: NEGATIVE mg/dL
Nitrite, UA: NEGATIVE
Protein Ur, POC: NEGATIVE mg/dL
Spec Grav, UA: 1.02 (ref 1.010–1.025)
Urobilinogen, UA: 0.2 E.U./dL
pH, UA: 7 (ref 5.0–8.0)

## 2022-03-27 MED ORDER — SULFAMETHOXAZOLE-TRIMETHOPRIM 800-160 MG PO TABS: 1.0000 | ORAL_TABLET | Freq: Two times a day (BID) | ORAL | 0 refills | Status: AC

## 2022-03-27 NOTE — ED Provider Notes (Incomplete)
[facility]    CSN: [id number] Arrival date & time: 03/27/22  0817    HISTORY  No chief complaint on file.  HPI Miranda Ramirez is a pleasant, 76 y.o. female who presents to urgent care today. Pt c/o urinary frequency, bladder discomfort, and hematuria that began last night, has not tried anything to alleviate her sx. Patient endorses increased frequency of urination, increased urge to urinate, and suprapubic pain.  Patient denies abnormal odor of urine, burning with urination, perineal pain, incontinence of urine, left-sided flank pain, right-sided flank pain, fever, chills, malaise, rigors, significant fatigue, abnormal vaginal discharge, vaginal itching, vaginal irritation, dyspareunia, and possible exposure to STD.      Past Medical History:  Diagnosis Date  . Hyperlipidemia   . Hypertension   . Osteoporosis   . SVD (spontaneous vaginal delivery)    x 2  . Tailbone injury 2017   Soreness at tailbone/?, no current problems 2019   Patient Active Problem List   Diagnosis Date Noted  . Vitamin D deficiency 07/25/2013  . Hyperlipidemia   . Hypertension   . Osteoporosis    Past Surgical History:  Procedure Laterality Date  . COLONOSCOPY  10/05/2016   TA  . COLONSCOPY  2018   polyps  . DILATATION & CURETTAGE/HYSTEROSCOPY WITH MYOSURE N/A 04/04/2018   Procedure: DILATATION & CURETTAGE/HYSTEROSCOPY WITH MYOSURE;  Surgeon: Christophe Louis, MD;  Location: [facility];  Service: Gynecology;  Laterality: N/A;  . POLYPECTOMY     OB History   No obstetric history on file.    Home Medications    Prior to Admission medications   Medication Sig Start Date End Date Taking? Authorizing Provider  amLODipine (NORVASC) 5 MG tablet Take 1 tablet (5 mg total) by mouth daily. 02/17/16   Orlena Sheldon, PA-C  Calcium Citrate-Vitamin D (CALCIUM + D PO) Take 2 tablets by mouth daily.    [provider]  Cholecalciferol (VITAMIN D3) 5000 units CAPS Take 5,000 Units by mouth  daily.    [provider]  ibuprofen (ADVIL,MOTRIN) 600 MG tablet Take 1 tablet (600 mg total) by mouth every 6 (six) hours as needed. 04/04/18   Christophe Louis, MD  losartan (COZAAR) 100 MG tablet Take 100 mg by mouth daily.    [provider]  Probiotic CAPS Take 1 capsule by mouth daily.    [provider]  simvastatin (ZOCOR) 20 MG tablet TAKE ONE TABLET BY MOUTH ONCE DAILY AT 6 PM Patient taking differently: Take 20 mg by mouth daily.  02/17/16   Orlena Sheldon, PA-C    Family History Family History  Problem Relation Age of Onset  . Diabetes Sister   . COPD Sister   . Cancer Sister 78       kidney cancer  . Breast cancer Neg Hx   . Colon cancer Neg Hx   . Colon polyps Neg Hx   . Esophageal cancer Neg Hx   . Rectal cancer Neg Hx   . Stomach cancer Neg Hx    Social History Social History   Tobacco Use  . Smoking status: Never  . Smokeless tobacco: Never  Vaping Use  . Vaping Use: Never used  Substance Use Topics  . Alcohol use: Not Currently    Comment: once a year  . Drug use: No   Allergies   Patient has no known allergies.  Review of Systems Review of Systems Pertinent findings revealed after performing a 14 point review of systems has  been noted in the history of present illness.  Physical Exam Triage Vital Signs ED Triage Vitals  Enc Vitals Group     BP 04/16/21 0827 (!) 147/82     Pulse Rate 04/16/21 0827 72     Resp 04/16/21 0827 18     Temp 04/16/21 0827 98.3 F (36.8 C)     Temp Source 04/16/21 0827 Oral     SpO2 04/16/21 0827 98 %     Weight --      Height --      Head Circumference --      Peak Flow --      Pain Score 04/16/21 0826 5     Pain Loc --      Pain Edu? --      Excl. in [place]? --   No data found.  Updated Vital Signs There were no vitals taken for this visit.  Physical Exam  Visual Acuity Right Eye Distance:   Left Eye Distance:   Bilateral Distance:    Right Eye Near:   Left Eye Near:    Bilateral  Near:     [facility] Couse / Diagnostics / Procedures:     Radiology No results found.  Procedures Procedures (including critical care time) EKG  Pending results:  Labs Reviewed  POCT URINALYSIS DIP (MANUAL ENTRY)    Medications Ordered in UC: Medications - No data to display  UC Diagnoses / Final Clinical Impressions(s)   I have reviewed the triage vital signs and the nursing notes.  Pertinent labs & imaging results that were available during my care of the patient were reviewed by me and considered in my medical decision making (see chart for details).    Final diagnoses:  None    {LMSTDP:27058}  {LMUTIP:27060}  ED Prescriptions   None    PDMP not reviewed this encounter.  Disposition Upon Discharge:  Condition: stable for discharge home  Patient presented with concern for an acute illness with associated systemic symptoms and significant discomfort requiring urgent management. In my opinion, this is a condition that a prudent lay person (someone who possesses an average knowledge of health and medicine) may potentially expect to result in complications if not addressed urgently such as respiratory distress, impairment of bodily function or dysfunction of bodily organs.   As such, the patient has been evaluated and assessed, work-up was performed and treatment was provided in alignment with urgent care protocols and evidence based medicine.  Patient/parent/caregiver has been advised that the patient may require follow up for further testing and/or treatment if the symptoms continue in spite of treatment, as clinically indicated and appropriate.  Routine symptom specific, illness specific and/or disease specific instructions were discussed with the patient and/or caregiver at length.  Prevention strategies for avoiding STD exposure were also discussed.  The patient will follow up with their current PCP if and as advised. If the patient does not currently have a PCP we will  assist them in obtaining one.   The patient may need specialty follow up if the symptoms continue, in spite of conservative treatment and management, for further workup, evaluation, consultation and treatment as clinically indicated and appropriate.  Patient/parent/caregiver verbalized understanding and agreement of plan as discussed.  All questions were addressed during visit.  Please see discharge instructions below for further details of plan.  Discharge Instructions: Discharge Instructions   None     This office note has been dictated using Dragon speech recognition software.  Unfortunately, this method of  dictation can sometimes lead to typographical or grammatical errors.  I apologize for your inconvenience in advance if this occurs.  Please do not hesitate to reach out to me if clarification is needed.

## 2022-03-27 NOTE — ED Triage Notes (Signed)
Pt c/o urinary frequency, bladder discomfort, and hematuria.   Started: last night   Home interventions: none

## 2022-03-27 NOTE — Discharge Instructions (Signed)
Common causes of urinary tract infections include but are not limited to holding your urine longer than you should, squatting instead of sitting down when urinating, sitting around in wet clothing such as a wet swimsuit or gym clothes too long, not emptying your bladder after having sexual intercourse, wiping from back to front instead of front to back after having a bowel movement.  Less common causes of urinary tract infections include but are not limited to anatomical shifts in the location of your bladder or uterus causing obstruction of passage of urine from your bladder to your urethra where your urine comes out or prolapse of your rectum into your vaginal wall.  These less common causes can be evaluated by gynecologist, a urologist or subspecialist called a uro-gynecologist   The urinalysis that we performed in the clinic today was abnormal.     You were advised to begin antibiotics today because your urinalysis is abnormal and you are having active symptoms of an acute lower urinary tract infection also known as cystitis.     Please pick up and begin taking your prescription for Bactrim DS (trimethoprim sulfamethoxazole) as soon as possible.  Please take all doses exactly as prescribed.  You can take this medication with or without food.  This medication is safe to take with your other medications.   If you have not had complete resolution of your symptoms after completing treatment as prescribed, please return to urgent care for repeat evaluation or follow-up with your primary care provider.   Thank you for visiting urgent care today.  I appreciate the opportunity to participate in your care.

## 2022-04-28 DIAGNOSIS — Z79899 Other long term (current) drug therapy: Secondary | ICD-10-CM | POA: Diagnosis not present

## 2022-04-28 DIAGNOSIS — M810 Age-related osteoporosis without current pathological fracture: Secondary | ICD-10-CM | POA: Diagnosis not present

## 2022-04-28 DIAGNOSIS — I10 Essential (primary) hypertension: Secondary | ICD-10-CM | POA: Diagnosis not present

## 2022-04-28 DIAGNOSIS — Z0000 Encounter for general adult medical examination without abnormal findings: Secondary | ICD-10-CM | POA: Diagnosis not present

## 2022-04-28 DIAGNOSIS — I700 Atherosclerosis of aorta: Secondary | ICD-10-CM | POA: Diagnosis not present

## 2022-04-28 DIAGNOSIS — R7301 Impaired fasting glucose: Secondary | ICD-10-CM | POA: Diagnosis not present

## 2022-04-28 DIAGNOSIS — E7800 Pure hypercholesterolemia, unspecified: Secondary | ICD-10-CM | POA: Diagnosis not present

## 2022-11-21 ENCOUNTER — Other Ambulatory Visit: Payer: Self-pay | Admitting: Family Medicine

## 2022-11-21 DIAGNOSIS — Z1231 Encounter for screening mammogram for malignant neoplasm of breast: Secondary | ICD-10-CM

## 2022-12-19 ENCOUNTER — Ambulatory Visit
Admission: RE | Admit: 2022-12-19 | Discharge: 2022-12-19 | Disposition: A | Discharge status: Home / Self Care | Payer: PPO | Source: Ambulatory Visit | Attending: Family Medicine | Admitting: Family Medicine

## 2022-12-19 DIAGNOSIS — Z1231 Encounter for screening mammogram for malignant neoplasm of breast: Secondary | ICD-10-CM | POA: Diagnosis not present

## 2023-02-09 DIAGNOSIS — H16143 Punctate keratitis, bilateral: Secondary | ICD-10-CM | POA: Diagnosis not present

## 2023-02-09 DIAGNOSIS — H04203 Unspecified epiphora, bilateral lacrimal glands: Secondary | ICD-10-CM | POA: Diagnosis not present

## 2023-05-09 DIAGNOSIS — I700 Atherosclerosis of aorta: Secondary | ICD-10-CM | POA: Diagnosis not present

## 2023-05-09 DIAGNOSIS — Z79899 Other long term (current) drug therapy: Secondary | ICD-10-CM | POA: Diagnosis not present

## 2023-05-09 DIAGNOSIS — R7301 Impaired fasting glucose: Secondary | ICD-10-CM | POA: Diagnosis not present

## 2023-05-09 DIAGNOSIS — M810 Age-related osteoporosis without current pathological fracture: Secondary | ICD-10-CM | POA: Diagnosis not present

## 2023-05-09 DIAGNOSIS — E7800 Pure hypercholesterolemia, unspecified: Secondary | ICD-10-CM | POA: Diagnosis not present

## 2023-05-09 DIAGNOSIS — Z0000 Encounter for general adult medical examination without abnormal findings: Secondary | ICD-10-CM | POA: Diagnosis not present

## 2023-05-17 ENCOUNTER — Other Ambulatory Visit: Payer: Self-pay | Admitting: Family Medicine

## 2023-05-17 DIAGNOSIS — E2839 Other primary ovarian failure: Secondary | ICD-10-CM

## 2023-08-31 DIAGNOSIS — H52221 Regular astigmatism, right eye: Secondary | ICD-10-CM | POA: Diagnosis not present

## 2023-08-31 DIAGNOSIS — H524 Presbyopia: Secondary | ICD-10-CM | POA: Diagnosis not present

## 2023-08-31 DIAGNOSIS — H2513 Age-related nuclear cataract, bilateral: Secondary | ICD-10-CM | POA: Diagnosis not present

## 2023-08-31 DIAGNOSIS — H5203 Hypermetropia, bilateral: Secondary | ICD-10-CM | POA: Diagnosis not present

## 2023-11-14 DIAGNOSIS — Z01419 Encounter for gynecological examination (general) (routine) without abnormal findings: Secondary | ICD-10-CM | POA: Diagnosis not present

## 2023-11-20 ENCOUNTER — Other Ambulatory Visit: Payer: Self-pay | Admitting: Family Medicine

## 2023-11-20 DIAGNOSIS — Z1231 Encounter for screening mammogram for malignant neoplasm of breast: Secondary | ICD-10-CM

## 2023-12-14 DIAGNOSIS — M25532 Pain in left wrist: Secondary | ICD-10-CM | POA: Diagnosis not present

## 2023-12-20 ENCOUNTER — Ambulatory Visit
Admission: RE | Admit: 2023-12-20 | Discharge: 2023-12-20 | Disposition: A | Discharge status: Home / Self Care | Source: Ambulatory Visit | Attending: Family Medicine | Admitting: Family Medicine

## 2023-12-20 DIAGNOSIS — Z1231 Encounter for screening mammogram for malignant neoplasm of breast: Secondary | ICD-10-CM | POA: Diagnosis not present

## 2023-12-26 ENCOUNTER — Ambulatory Visit (HOSPITAL_BASED_OUTPATIENT_CLINIC_OR_DEPARTMENT_OTHER)
Admission: RE | Admit: 2023-12-26 | Discharge: 2023-12-26 | Disposition: A | Discharge status: Home / Self Care | Source: Ambulatory Visit | Attending: Family Medicine | Admitting: Family Medicine

## 2023-12-26 DIAGNOSIS — Z780 Asymptomatic menopausal state: Secondary | ICD-10-CM | POA: Diagnosis not present

## 2023-12-26 DIAGNOSIS — E2839 Other primary ovarian failure: Secondary | ICD-10-CM | POA: Insufficient documentation

## 2023-12-26 DIAGNOSIS — M810 Age-related osteoporosis without current pathological fracture: Secondary | ICD-10-CM | POA: Diagnosis not present

## 2023-12-28 DIAGNOSIS — M25532 Pain in left wrist: Secondary | ICD-10-CM | POA: Diagnosis not present

## 2024-01-09 ENCOUNTER — Other Ambulatory Visit: Payer: PPO

## 2024-05-13 DIAGNOSIS — R7301 Impaired fasting glucose: Secondary | ICD-10-CM | POA: Diagnosis not present

## 2024-05-13 DIAGNOSIS — I700 Atherosclerosis of aorta: Secondary | ICD-10-CM | POA: Diagnosis not present

## 2024-05-13 DIAGNOSIS — Z1331 Encounter for screening for depression: Secondary | ICD-10-CM | POA: Diagnosis not present

## 2024-05-13 DIAGNOSIS — Z0000 Encounter for general adult medical examination without abnormal findings: Secondary | ICD-10-CM | POA: Diagnosis not present

## 2024-05-13 DIAGNOSIS — M810 Age-related osteoporosis without current pathological fracture: Secondary | ICD-10-CM | POA: Diagnosis not present

## 2024-05-13 DIAGNOSIS — I10 Essential (primary) hypertension: Secondary | ICD-10-CM | POA: Diagnosis not present

## 2024-05-13 DIAGNOSIS — E7800 Pure hypercholesterolemia, unspecified: Secondary | ICD-10-CM | POA: Diagnosis not present

## 2024-05-13 DIAGNOSIS — Z79899 Other long term (current) drug therapy: Secondary | ICD-10-CM | POA: Diagnosis not present
